# Patient Record
Sex: Female | Born: 1940
Health system: Southern US, Community
[De-identification: ages and names within clinical notes are randomized; demographics above are authoritative.]

## PROBLEM LIST (undated history)

## (undated) DIAGNOSIS — I10 Essential (primary) hypertension: Secondary | ICD-10-CM

## (undated) DIAGNOSIS — M810 Age-related osteoporosis without current pathological fracture: Secondary | ICD-10-CM

## (undated) HISTORY — DX: Essential (primary) hypertension: I10

## (undated) HISTORY — PX: TONSILLECTOMY: SUR1361

## (undated) HISTORY — PX: TOOTH EXTRACTION: SUR596

## (undated) HISTORY — PX: KNEE SURGERY: SHX244

## (undated) HISTORY — PX: DILATION AND CURETTAGE OF UTERUS: SHX78

## (undated) HISTORY — DX: Age-related osteoporosis without current pathological fracture: M81.0

---

## 1992-10-06 ENCOUNTER — Encounter: Payer: Self-pay | Admitting: Internal Medicine

## 1998-09-25 ENCOUNTER — Encounter: Admission: RE | Admit: 1998-09-25 | Discharge: 1998-12-24 | Payer: Self-pay | Admitting: *Deleted

## 1999-04-06 ENCOUNTER — Other Ambulatory Visit: Admission: RE | Admit: 1999-04-06 | Discharge: 1999-04-06 | Payer: Self-pay | Admitting: *Deleted

## 1999-04-24 ENCOUNTER — Other Ambulatory Visit: Admission: RE | Admit: 1999-04-24 | Discharge: 1999-04-24 | Payer: Self-pay | Admitting: *Deleted

## 1999-08-21 ENCOUNTER — Emergency Department (HOSPITAL_COMMUNITY): Admission: EM | Admit: 1999-08-21 | Discharge: 1999-08-21 | Payer: Self-pay | Admitting: Emergency Medicine

## 2000-04-06 ENCOUNTER — Other Ambulatory Visit: Admission: RE | Admit: 2000-04-06 | Discharge: 2000-04-06 | Payer: Self-pay | Admitting: Obstetrics & Gynecology

## 2000-04-20 ENCOUNTER — Encounter (INDEPENDENT_AMBULATORY_CARE_PROVIDER_SITE_OTHER): Payer: Self-pay

## 2000-04-20 ENCOUNTER — Other Ambulatory Visit: Admission: RE | Admit: 2000-04-20 | Discharge: 2000-04-20 | Payer: Self-pay | Admitting: Obstetrics & Gynecology

## 2000-04-20 ENCOUNTER — Encounter: Payer: Self-pay | Admitting: Obstetrics & Gynecology

## 2000-04-20 ENCOUNTER — Encounter: Admission: RE | Admit: 2000-04-20 | Discharge: 2000-04-20 | Payer: Self-pay | Admitting: Obstetrics & Gynecology

## 2001-04-07 ENCOUNTER — Encounter: Payer: Self-pay | Admitting: Obstetrics & Gynecology

## 2001-04-07 ENCOUNTER — Encounter: Admission: RE | Admit: 2001-04-07 | Discharge: 2001-04-07 | Payer: Self-pay | Admitting: Obstetrics & Gynecology

## 2001-04-07 ENCOUNTER — Other Ambulatory Visit: Admission: RE | Admit: 2001-04-07 | Discharge: 2001-04-07 | Payer: Self-pay | Admitting: Obstetrics & Gynecology

## 2001-09-03 ENCOUNTER — Emergency Department (HOSPITAL_COMMUNITY): Admission: EM | Admit: 2001-09-03 | Discharge: 2001-09-03 | Payer: Self-pay | Admitting: Emergency Medicine

## 2002-04-12 ENCOUNTER — Other Ambulatory Visit: Admission: RE | Admit: 2002-04-12 | Discharge: 2002-04-12 | Payer: Self-pay | Admitting: Obstetrics & Gynecology

## 2003-04-17 ENCOUNTER — Encounter: Admission: RE | Admit: 2003-04-17 | Discharge: 2003-04-17 | Payer: Self-pay | Admitting: Obstetrics & Gynecology

## 2003-04-17 ENCOUNTER — Encounter: Payer: Self-pay | Admitting: Obstetrics & Gynecology

## 2004-04-23 ENCOUNTER — Encounter: Admission: RE | Admit: 2004-04-23 | Discharge: 2004-04-23 | Payer: Self-pay | Admitting: Internal Medicine

## 2004-05-04 ENCOUNTER — Other Ambulatory Visit: Admission: RE | Admit: 2004-05-04 | Discharge: 2004-05-04 | Payer: Self-pay | Admitting: Obstetrics & Gynecology

## 2004-11-11 ENCOUNTER — Ambulatory Visit: Payer: Self-pay | Admitting: Internal Medicine

## 2005-03-22 ENCOUNTER — Ambulatory Visit: Payer: Self-pay | Admitting: Internal Medicine

## 2005-03-24 ENCOUNTER — Ambulatory Visit: Payer: Self-pay | Admitting: Family Medicine

## 2005-04-26 ENCOUNTER — Ambulatory Visit: Payer: Self-pay

## 2005-05-06 ENCOUNTER — Encounter: Admission: RE | Admit: 2005-05-06 | Discharge: 2005-05-06 | Payer: Self-pay | Admitting: Internal Medicine

## 2006-05-09 ENCOUNTER — Encounter: Admission: RE | Admit: 2006-05-09 | Discharge: 2006-05-09 | Payer: Self-pay | Admitting: Obstetrics & Gynecology

## 2006-05-19 ENCOUNTER — Ambulatory Visit: Payer: Self-pay | Admitting: Internal Medicine

## 2006-12-05 ENCOUNTER — Ambulatory Visit: Payer: Self-pay | Admitting: Internal Medicine

## 2006-12-20 HISTORY — PX: COLONOSCOPY: SHX5424

## 2007-05-11 ENCOUNTER — Encounter: Admission: RE | Admit: 2007-05-11 | Discharge: 2007-05-11 | Payer: Self-pay | Admitting: Obstetrics & Gynecology

## 2007-07-11 ENCOUNTER — Ambulatory Visit: Payer: Self-pay | Admitting: Internal Medicine

## 2007-07-13 ENCOUNTER — Encounter (INDEPENDENT_AMBULATORY_CARE_PROVIDER_SITE_OTHER): Payer: Self-pay | Admitting: *Deleted

## 2007-07-14 ENCOUNTER — Encounter (INDEPENDENT_AMBULATORY_CARE_PROVIDER_SITE_OTHER): Payer: Self-pay | Admitting: *Deleted

## 2007-07-18 ENCOUNTER — Ambulatory Visit: Payer: Self-pay | Admitting: Internal Medicine

## 2007-07-20 ENCOUNTER — Encounter (INDEPENDENT_AMBULATORY_CARE_PROVIDER_SITE_OTHER): Payer: Self-pay | Admitting: *Deleted

## 2007-07-27 ENCOUNTER — Ambulatory Visit: Payer: Self-pay | Admitting: Internal Medicine

## 2007-07-28 ENCOUNTER — Encounter: Payer: Self-pay | Admitting: Internal Medicine

## 2007-07-28 LAB — CONVERTED CEMR LAB
Hemoglobin, Urine: NEGATIVE
Ketones, ur: NEGATIVE mg/dL
Leukocytes, UA: NEGATIVE
Nitrite: NEGATIVE
Protein, ur: NEGATIVE mg/dL
pH: 7 (ref 5.0–8.0)

## 2007-07-30 ENCOUNTER — Encounter: Payer: Self-pay | Admitting: Internal Medicine

## 2007-07-31 ENCOUNTER — Encounter (INDEPENDENT_AMBULATORY_CARE_PROVIDER_SITE_OTHER): Payer: Self-pay | Admitting: *Deleted

## 2007-07-31 ENCOUNTER — Encounter: Payer: Self-pay | Admitting: Internal Medicine

## 2007-11-07 ENCOUNTER — Ambulatory Visit: Payer: Self-pay | Admitting: Internal Medicine

## 2007-11-13 ENCOUNTER — Encounter: Payer: Self-pay | Admitting: Internal Medicine

## 2008-01-28 ENCOUNTER — Emergency Department (HOSPITAL_COMMUNITY): Admission: EM | Admit: 2008-01-28 | Discharge: 2008-01-28 | Payer: Self-pay | Admitting: Emergency Medicine

## 2008-01-30 ENCOUNTER — Ambulatory Visit: Payer: Self-pay | Admitting: Internal Medicine

## 2008-02-06 ENCOUNTER — Encounter: Payer: Self-pay | Admitting: Internal Medicine

## 2008-03-08 ENCOUNTER — Ambulatory Visit: Payer: Self-pay | Admitting: Internal Medicine

## 2008-05-14 ENCOUNTER — Encounter: Admission: RE | Admit: 2008-05-14 | Discharge: 2008-05-14 | Payer: Self-pay | Admitting: Obstetrics & Gynecology

## 2009-05-15 ENCOUNTER — Encounter: Admission: RE | Admit: 2009-05-15 | Discharge: 2009-05-15 | Payer: Self-pay | Admitting: Obstetrics & Gynecology

## 2009-06-11 ENCOUNTER — Encounter: Payer: Self-pay | Admitting: Internal Medicine

## 2009-06-16 ENCOUNTER — Encounter: Payer: Self-pay | Admitting: Internal Medicine

## 2009-06-19 ENCOUNTER — Ambulatory Visit: Payer: Self-pay | Admitting: Internal Medicine

## 2009-06-19 DIAGNOSIS — R9431 Abnormal electrocardiogram [ECG] [EKG]: Secondary | ICD-10-CM

## 2009-06-19 DIAGNOSIS — E785 Hyperlipidemia, unspecified: Secondary | ICD-10-CM | POA: Insufficient documentation

## 2009-06-25 ENCOUNTER — Encounter (INDEPENDENT_AMBULATORY_CARE_PROVIDER_SITE_OTHER): Payer: Self-pay | Admitting: *Deleted

## 2009-06-25 ENCOUNTER — Encounter: Payer: Self-pay | Admitting: Internal Medicine

## 2009-07-01 ENCOUNTER — Telehealth (INDEPENDENT_AMBULATORY_CARE_PROVIDER_SITE_OTHER): Payer: Self-pay | Admitting: *Deleted

## 2009-12-18 ENCOUNTER — Encounter: Admission: RE | Admit: 2009-12-18 | Discharge: 2009-12-18 | Payer: Self-pay | Admitting: Obstetrics & Gynecology

## 2010-01-14 ENCOUNTER — Telehealth: Payer: Self-pay | Admitting: Internal Medicine

## 2010-01-16 ENCOUNTER — Encounter: Payer: Self-pay | Admitting: Internal Medicine

## 2010-05-19 ENCOUNTER — Encounter: Admission: RE | Admit: 2010-05-19 | Discharge: 2010-05-19 | Payer: Self-pay | Admitting: Obstetrics & Gynecology

## 2010-07-09 ENCOUNTER — Ambulatory Visit: Payer: Self-pay | Admitting: Internal Medicine

## 2010-07-09 DIAGNOSIS — M25519 Pain in unspecified shoulder: Secondary | ICD-10-CM

## 2010-07-09 DIAGNOSIS — M8580 Other specified disorders of bone density and structure, unspecified site: Secondary | ICD-10-CM

## 2010-07-21 ENCOUNTER — Ambulatory Visit: Payer: Self-pay | Admitting: Internal Medicine

## 2010-07-27 LAB — CONVERTED CEMR LAB
ALT: 32 units/L (ref 0–35)
Albumin: 3.9 g/dL (ref 3.5–5.2)
BUN: 16 mg/dL (ref 6–23)
CO2: 31 meq/L (ref 19–32)
Calcium: 9 mg/dL (ref 8.4–10.5)
Chloride: 108 meq/L (ref 96–112)
Creatinine, Ser: 0.8 mg/dL (ref 0.4–1.2)
Direct LDL: 145.9 mg/dL
Eosinophils Relative: 3.4 % (ref 0.0–5.0)
Glucose, Bld: 94 mg/dL (ref 70–99)
HCT: 39.5 % (ref 36.0–46.0)
HDL: 57.1 mg/dL (ref >39.00)
Hemoglobin: 13.1 g/dL (ref 12.0–15.0)
Lymphs Abs: 1.6 10*3/uL (ref 0.7–4.0)
MCV: 84.4 fL (ref 78.0–100.0)
Monocytes Absolute: 0.5 10*3/uL (ref 0.1–1.0)
Monocytes Relative: 9.8 % (ref 3.0–12.0)
Neutro Abs: 3.2 10*3/uL (ref 1.4–7.7)
Platelets: 263 10*3/uL (ref 150.0–400.0)
RDW: 14.2 % (ref 11.5–14.6)
Total Protein: 6.7 g/dL (ref 6.0–8.3)
Triglycerides: 97 mg/dL (ref 0.0–149.0)

## 2010-07-29 ENCOUNTER — Ambulatory Visit: Payer: Self-pay | Admitting: Internal Medicine

## 2010-07-31 ENCOUNTER — Ambulatory Visit: Payer: Self-pay | Admitting: Internal Medicine

## 2010-07-31 DIAGNOSIS — L0390 Cellulitis, unspecified: Secondary | ICD-10-CM

## 2010-07-31 DIAGNOSIS — L0291 Cutaneous abscess, unspecified: Secondary | ICD-10-CM

## 2010-09-30 ENCOUNTER — Encounter: Payer: Self-pay | Admitting: Internal Medicine

## 2011-01-17 LAB — CONVERTED CEMR LAB
AST: 24 units/L (ref 0–37)
Albumin: 4.4 g/dL (ref 3.5–5.2)
Alkaline Phosphatase: 46 units/L (ref 39–117)
BUN: 16 mg/dL (ref 6–23)
Basophils Absolute: 0 10*3/uL (ref 0.0–0.1)
Basophils Relative: 0.2 % (ref 0.0–1.0)
Bilirubin, Direct: 0 mg/dL (ref 0.0–0.3)
Bilirubin, Direct: 0.1 mg/dL (ref 0.0–0.3)
CO2: 31 meq/L (ref 19–32)
CO2: 32 meq/L (ref 19–32)
Calcium: 9.2 mg/dL (ref 8.4–10.5)
Chloride: 104 meq/L (ref 96–112)
Chloride: 97 meq/L (ref 96–112)
Cholesterol: 198 mg/dL (ref 0–200)
Creatinine, Ser: 0.8 mg/dL (ref 0.4–1.2)
Creatinine, Ser: 0.8 mg/dL (ref 0.4–1.2)
Eosinophils Absolute: 0.2 10*3/uL (ref 0.0–0.7)
Eosinophils Relative: 1.8 % (ref 0.0–5.0)
Glucose, Bld: 90 mg/dL (ref 70–99)
HCT: 41.9 % (ref 36.0–46.0)
Hemoglobin: 14 g/dL (ref 12.0–15.0)
LDL Cholesterol: 128 mg/dL — ABNORMAL HIGH (ref 0–99)
Lymphocytes Relative: 32.2 % (ref 12.0–46.0)
MCHC: 33.4 g/dL (ref 30.0–36.0)
MCHC: 34 g/dL (ref 30.0–36.0)
MCV: 84.6 fL (ref 78.0–100.0)
Monocytes Absolute: 0.4 10*3/uL (ref 0.1–1.0)
Monocytes Absolute: 0.6 10*3/uL (ref 0.2–0.7)
Neutrophils Relative %: 54.7 % (ref 43.0–77.0)
Neutrophils Relative %: 58.3 % (ref 43.0–77.0)
Platelets: 275 10*3/uL (ref 150.0–400.0)
Potassium: 3.7 meq/L (ref 3.5–5.1)
RBC: 4.9 M/uL (ref 3.87–5.11)
RDW: 12.8 % (ref 11.5–14.6)
Sodium: 135 meq/L (ref 135–145)
TSH: 2.55 microintl units/mL (ref 0.35–5.50)
TSH: 2.84 microintl units/mL (ref 0.35–5.50)
Total Bilirubin: 0.8 mg/dL (ref 0.3–1.2)
Total Bilirubin: 0.9 mg/dL (ref 0.3–1.2)
Total CHOL/HDL Ratio: 3.9
Total Protein: 7.5 g/dL (ref 6.0–8.3)
WBC: 4.6 10*3/uL (ref 4.5–10.5)
WBC: 5.8 10*3/uL (ref 4.5–10.5)

## 2011-01-19 NOTE — Miscellaneous (Signed)
Summary: [facility]  [facility]   Imported By: Lanelle Bal 10/12/2010 14:21:45  _____________________________________________________________________  External Attachment:    Type:   Image     Comment:   External Document

## 2011-01-19 NOTE — Assessment & Plan Note (Signed)
Summary: reaction to pneumonia vac/cbs   Vital Signs:  Patient profile:   70 year old female Weight:      204.2 pounds Temp:     98.4 degrees F oral Pulse rate:   77 / minute Resp:     14 per minute BP sitting:   118 / 76  (left arm) Cuff size:   large  Vitals Entered By: Shonna Chock CMA (July 31, 2010 4:37 PM) CC: Reaction to pneumonia vaccine given on 07/29/10: Arm is red, swollen, painful, and warm/tender to the touch   CC:  Reaction to pneumonia vaccine given on 07/29/10: Arm is red, swollen, painful, and and warm/tender to the touch.  History of Present Illness: She noted soreness @ injection site  Weds afternoon after Pneumonia  vaccine that am. Redness was also noted that afternoon; it has progressed in size . Rx: OTC pain relievers with minimal response.No PMH of immunization reaction or egg intolerance. Motrin caused nightmares.  Current Medications (verified): 1)  Centrum Silver 2)  Glucosamine 1500mg  3)  Caltrate 600+d 600-400 Mg-Unit Tabs (Calcium Carbonate-Vitamin D) .Marland Kitchen.. 1 By Mouth Once Daily 4)  Vit D1000 5)  Fish Oil 6)  Aspirin 81 Mg Tabs (Aspirin) .Marland Kitchen.. 1 By Mouth Once Daily  Allergies: 1)  Motrin  Review of Systems General:  Denies chills and fever; Hot flashes. Resp:  Denies shortness of breath and wheezing. MS:  Complains of joint pain; R shoulder pain persists. Derm:  Complains of changes in color of skin; denies itching. Allergy:  Denies hives or rash; No angioedema.  Physical Exam  General:  well-nourished,in no acute distress; alert,appropriate and cooperative throughout examination Mouth:  Oral mucosa and oropharynx without lesions or exudates.  Teeth in good repair. Lungs:  Normal respiratory effort, chest expands symmetrically. Lungs are clear to auscultation, no crackles or wheezes. Heart:  regular rhythm, no murmur, no gallop, no rub, no JVD, and bradycardia.   Skin:  5.5X 4.5 cm erythematous area with tenderness & increased temp Cervical  Nodes:  No lymphadenopathy noted Axillary Nodes:  No palpable lymphadenopathy   Impression & Recommendations:  Problem # 1:  CELLULITIS (ICD-682.9)  Orders: Prescription Created Electronically [phone])  Her updated medication list for this problem includes:    Cephalexin 500 Mg Caps (Cephalexin) .Marland Kitchen... 1 two times a day  Problem # 2:  SHOULDER PAIN, RIGHT (ICD-719.41)  Her updated medication list for this problem includes:    Aspirin 81 Mg Tabs (Aspirin) .Marland Kitchen... 1 by mouth once daily    Tramadol Hcl 50 Mg Tabs (Tramadol hcl) .Marland Kitchen... 1/2-1 every 6 hrs as needed  for pain  Orders: Prescription Created Electronically [phone]) Physical Therapy Referral (PT)  Complete Medication List: 1)  Centrum Silver  2)  Glucosamine 1500mg   3)  Caltrate 600+d 600-400 Mg-unit Tabs (Calcium carbonate-vitamin d) .Marland Kitchen.. 1 by mouth once daily 4)  Vit D1000  5)  Fish Oil  6)  Aspirin 81 Mg Tabs (Aspirin) .Marland Kitchen.. 1 by mouth once daily 7)  Cephalexin 500 Mg Caps (Cephalexin) .Marland Kitchen.. 1 two times a day 8)  Tramadol Hcl 50 Mg Tabs (Tramadol hcl) .... 1/2-1 every 6 hrs as needed  for pain  Patient Instructions: 1)  Cool compresses 3-4X/ day as needed for pain or tenderness. Measure size of redness everyday. Report fever or red streaks going up arm. Prescriptions: TRAMADOL HCL 50 MG TABS (TRAMADOL HCL) 1/2-1 every 6 hrs as needed  for pain  #30 x 0   Entered and Authorized by:  Marga Melnick MD   Signed by:   Marga Melnick MD on 07/31/2010   Method used:   Faxed to ...       [facility]  [address] # [id number]* (retail)       [address]       [place], [place]  [address]       Ph: [phone] or [phone]       Fax: [phone]   RxID:   [id number] CEPHALEXIN 500 MG CAPS (CEPHALEXIN) 1 two times a day  #14 x 0   Entered and Authorized by:   Marga Melnick MD   Signed by:   Marga Melnick MD on 07/31/2010   Method used:   Faxed to ...       [facility]  [address] # [id number]* (retail)        [address]       [place], [place]  [address]       Ph: [phone] or [phone]       Fax: [phone]   RxID:   [id number]

## 2011-01-19 NOTE — Progress Notes (Signed)
Summary: Family History Worksheet Brought by Patient  Family History Worksheet Brought by Patient   Imported By: Lanelle Bal 07/16/2010 13:16:30  _____________________________________________________________________  External Attachment:    Type:   Image     Comment:   External Document

## 2011-01-19 NOTE — Progress Notes (Signed)
Summary: ? right med   Phone Note Call from Patient   Caller: Patient Summary of Call: Pt left VM that she is taking glucosamine 1500mg  2 tab once daily and vitamin d 1000units once daily and her right thumb continues to pop in and out of joint and it is very painful for her. pt would like to know is she taking the right meds for this. dr Davante Gerke please advise.................Marland KitchenFelecia Deloach CMA  January 14, 2010 10:06 AM    Follow-up for Phone Call        see referral  PATIENT HAS AN APPT W/[facility]/DR. KUZMA, ON 01-16-2010.  I SPOKE WITH PT SHE IS AWARE. Follow-up by: Magdalen Spatz [facility],  January 14, 2010 2:24 PM  New Problems: THUMB PAIN (ICD-729.5)   New Problems: THUMB PAIN (ICD-729.5)

## 2011-01-19 NOTE — Consult Note (Signed)
Summary: [facility]   Imported By: Lanelle Bal 01/26/2010 09:36:55  _____________________________________________________________________  External Attachment:    Type:   Image     Comment:   External Document

## 2011-01-19 NOTE — Assessment & Plan Note (Signed)
Summary: YEARLY CHECK/CBS   Vital Signs:  Patient profile:   70 year old female Height:      64.25 inches Weight:      201 pounds BMI:     34.36 Temp:     98.4 degrees F oral Pulse rate:   84 / minute Resp:     14 per minute BP sitting:   138 / 84  (left arm) Cuff size:   large  Vitals Entered By: Shonna Chock CMA (July 09, 2010 2:18 PM) CC: CPX, Lipid Management   CC:  CPX and Lipid Management.  History of Present Illness: Here for Medicare AWV: 1.Risk factors based on Past M, S, F history:dyslipidemia, abnormal EKG, Osteopenia (see updated chart entries) 2.Physical Activities:see entry  3.Depression/mood:  4.Hearing: whisper heard @ 6 feet 5.ADL's:  no limitations 6.Fall Risk: none  7.Home Safety:  no risk; firearms locked  8.Height, weight, &visual acuity:wall chart read with lenses @ 6 ft 9.Counseling: none requested exceot recent RUE injury 10.Labs ordered based on risk factors: see Orders 11.Referral Coordination: Physical Therapy if no better with swimming laps 12. Care Plan: see Instructions 13. Cognitive Assessment:memory intact ; subtraction of 7 normal; recall normal; OX 3   Lipid Management History:      Positive NCEP/ATP III risk factors include female age 30 years old or older.  Negative NCEP/ATP III risk factors include non-diabetic, no family history for ischemic heart disease, non-tobacco-user status, non-hypertensive, no ASHD (atherosclerotic heart disease), no prior stroke/TIA, no peripheral vascular disease, and no history of aortic aneurysm.     Preventive Screening-Counseling & Management  Alcohol-Tobacco     Alcohol drinks/day: 0     Smoking Status: never  Caffeine-Diet-Exercise     Caffeine use/day: occasioanlly     Diet Comments: no diet     Does Patient Exercise: no  Hep-HIV-STD-Contraception     Dental Visit-last 6 months yes     Sun Exposure-Excessive: yes     Sun Exposure Counseling: to decrease sun  exposure  Safety-Violence-Falls     Seat Belt Use: yes     Firearms in the Home: firearms in the home     Firearm Counseling: husband V N Physicist, medical: yes     Violence in the Home: no risk noted     Sexual Abuse: no     Fall Risk: denied      Sexual History:  currently monogamous.        Drug Use:  never.        Blood Transfusions:  no.        Travel History:  no foreign travel since 1991.    Allergies: 1)  Motrin  Past History:  Past Medical History: Hyperlipidemia: [place] LDL goal = < 160. NS ST-T EKG changes, stable since 2003; cervical DDD  with RUE radicular pain; Osteopenia, Dr Seymour Bars  Past Surgical History:  G 2 p 2 ;D&C; uterine biopsy; Colonoscopy negative Tonsillectomy  Family History: Father:post herpetic neuralgia, CABG in 32s  Mother: DDD Siblings: sister : DDD. 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 [employer] Does Patient Exercise:  no Dental Care w/in 6 mos.:  yes Sun Exposure-Excessive:  yes Seat Belt Use:  yes Fall Risk:  denied Sexual History:  currently monogamous Drug Use:  never Blood Transfusions:  no  Review of Systems  The patient denies anorexia, fever, vision loss, decreased hearing, hoarseness,  chest pain, syncope, dyspnea on exertion, peripheral edema, headaches, hemoptysis, abdominal pain, melena, hematochezia, severe indigestion/heartburn, hematuria, incontinence, suspicious skin lesions, depression, unusual weight change, abnormal bleeding, enlarged lymph nodes, and angioedema.         Weight up 25#. Chronic throat clearing  & hoarseness better with Neti pot MS:  Complains of joint pain; denies joint redness, joint swelling, low back pain, mid back pain, and thoracic pain. Neuro:  Denies difficulty with concentration, poor balance, tingling, and weakness; Occasional  numbness R thigh. Allergy:  Complains  of sneezing; denies itching eyes.  Physical Exam  General:  well-nourished,in no acute distress; alert,appropriate and cooperative throughout examination Head:  Normocephalic and atraumatic without obvious abnormalities.  Eyes:  No corneal or conjunctival inflammation noted. Perrla. Funduscopic exam benign, without hemorrhages, exudates or papilledema. Ears:  External ear exam shows no significant lesions or deformities.  Otoscopic examination reveals clear canals, tympanic membranes are intact bilaterally without bulging, retraction, inflammation or discharge. Hearing is grossly normal bilaterally. Some wax bilaterally Nose:  External nasal examination shows no deformity or inflammation. Nasal mucosa are pink and moist without lesions or exudates. Mouth:  Oral mucosa and oropharynx without lesions or exudates.  Teeth in good repair. Neck:  No deformities, masses, or tenderness noted. Lungs:  Normal respiratory effort, chest expands symmetrically. Lungs are clear to auscultation, no crackles or wheezes. Heart:  Normal rate and regular rhythm. Split  S1 without gallop, click, rub . Grade1/2-  1 systolic murmur Abdomen:  Bowel sounds positive,abdomen soft and non-tender without masses, organomegaly or hernias noted. Genitalia:  Dr Seymour Bars Msk:  No deformity or scoliosis noted of thoracic or lumbar spine.   Pulses:  R and L carotid,radial,dorsalis pedis and posterior tibial pulses are full and equal bilaterally Extremities:  No clubbing, cyanosis, edema. Full ROM R shoulder. Minor DIP OA Neurologic:  alert & oriented X3, strength normal in all extremities, and DTRs symmetrical and normal.   Skin:  Intact without suspicious lesions or rashes Cervical Nodes:  No lymphadenopathy noted Axillary Nodes:  No palpable lymphadenopathy Psych:  memory intact for recent and remote, normally interactive, good eye contact, not anxious appearing, and not depressed appearing.     Impression &  Recommendations:  Problem # 1:  PREVENTIVE HEALTH CARE (ICD-V70.0)  Orders: Subsequent annual wellness visit with prevention plan (G9562)  Problem # 2:  SHOULDER PAIN, RIGHT (ICD-719.41)  Problem # 3:  HYPERLIPIDEMIA (ICD-272.4)  Problem # 4:  NONSPECIFIC ABNORMAL ELECTROCARDIOGRAM (ICD-794.31)  stable  Orders: EKG w/ Interpretation (93000)  Problem # 5:  OSTEOPENIA (ICD-733.90)  Complete Medication List: 1)  Centrum Silver  2)  Glucosamine 1500mg   3)  Caltrate 600+d 600-400 Mg-unit Tabs (Calcium carbonate-vitamin d) .Marland Kitchen.. 1 by mouth once daily 4)  Vit D1000  5)  Fish Oil   Other Orders: State-TD Vaccine 7 yrs. & > IM (13086V) Admin 1st Vaccine (78469)  Lipid Assessment/Plan:      Based on NCEP/ATP III, the patient's risk factor category is "0-1 risk factors".  The patient's lipid goals are as follows: Total cholesterol goal is 200; LDL cholesterol goal is 160; HDL cholesterol goal is 40; Triglyceride goal is 150.  Her LDL cholesterol goal has been met.    Patient Instructions: 1)  Please schedule fasting labs: vit D level; 2)  BMP ; 3)  Hepatic Panel ; 4)  Lipid Panel ; 5)  TSH ; 6)  CBC w/ Diff. 7)  Take an  81 mg coated Aspirin every day.Physical  Therapy if shoulder does not improve. Call if you reconsider pain meds   Immunizations Administered:  Tetanus Vaccine:    Vaccine Type: Tdap (State)    Site: left deltoid    Mfr: GlaxoSmithKline    Dose: 0.5 ml    Route: IM    Given by: Shonna Chock CMA    Exp. Date: 03/13/2012    Lot #: [id number]    VIS given: 11/07/07 version given July 09, 2010.

## 2011-01-19 NOTE — Assessment & Plan Note (Signed)
Summary: PNEUMONIA SHOT///SPH  Nurse Visit   Allergies: 1)  Motrin  Immunizations Administered:  Pneumonia Vaccine:    Vaccine Type: Pneumovax (Medicare)    Site: right deltoid    Mfr: Merck    Dose: 0.5 ml    Route: IM    Given by: Shonna Chock CMA    Exp. Date: 01/06/2012    Lot #: [id number]  Orders Added: 1)  Pneumococcal Vaccine [90732] 2)  Admin 1st Vaccine [75643]  Appended Document: PNEUMONIA SHOT///SPH

## 2011-04-16 ENCOUNTER — Other Ambulatory Visit: Payer: Self-pay | Admitting: Obstetrics & Gynecology

## 2011-04-16 DIAGNOSIS — Z1231 Encounter for screening mammogram for malignant neoplasm of breast: Secondary | ICD-10-CM

## 2011-05-06 ENCOUNTER — Ambulatory Visit (AMBULATORY_SURGERY_CENTER): Payer: Medicare Other

## 2011-05-06 ENCOUNTER — Encounter: Payer: Self-pay | Admitting: Internal Medicine

## 2011-05-06 VITALS — Ht 64.0 in | Wt 191.5 lb

## 2011-05-06 DIAGNOSIS — Z1211 Encounter for screening for malignant neoplasm of colon: Secondary | ICD-10-CM

## 2011-05-06 MED ORDER — PEG-KCL-NACL-NASULF-NA ASC-C 100 G PO SOLR: 1.0000 | Freq: Once | ORAL | Status: AC

## 2011-05-14 ENCOUNTER — Ambulatory Visit (INDEPENDENT_AMBULATORY_CARE_PROVIDER_SITE_OTHER): Payer: Medicare Other | Admitting: Internal Medicine

## 2011-05-14 ENCOUNTER — Encounter: Payer: Self-pay | Admitting: Internal Medicine

## 2011-05-14 VITALS — BP 122/80 | HR 60 | Temp 98.2°F | Wt 192.4 lb

## 2011-05-14 DIAGNOSIS — M25569 Pain in unspecified knee: Secondary | ICD-10-CM

## 2011-05-14 DIAGNOSIS — M25562 Pain in left knee: Secondary | ICD-10-CM

## 2011-05-14 MED ORDER — TRAMADOL HCL 50 MG PO TABS: 50.0000 mg | ORAL_TABLET | Freq: Four times a day (QID) | ORAL | Status: AC | PRN

## 2011-05-14 NOTE — Progress Notes (Signed)
  Subjective:    Patient ID: Abigail Long, female    DOB: May 26, 1941, 70 y.o.   MRN: [id number]  HPI Extremity pain Onset:3 weeks ago Trigger/injury:she slipped on wet grass  & did split with hyperflexion of L knee & hyperextension of groin w/o feeling or hearing a "pop" Quality:dull     Severity : up to 4  Duration: intermittent; worse with getting up , must "hoist " herself up ROS: Constitutional: no fever, chills, sweats, change in weight Musculoskeletal:no muscle cramp but muscle pain in various locations in LLE ( mainly around L knee , upper calf & minimally to L ankle); no  joint stiffness, redness, or swelling Skin:no rash, color change Neuro:no  weakness, incontinence (stool/urine), numbness and tingling, tremor Heme:no lymphadenopathy, abnormal bruising or clotting Treatment/response:Aleve 2 tabs every 8 hrs with benefit     Review of Systems     Objective:   Physical Exam she appears healthy and in no acute distress. There is some discomfort with range of motion testing of the left lower extremity.  She is able to lay back and sit up without help. With ambulation she limps on the left knee.  Deep tendon reflexes are 0-1/2+ at the knees.  Pedal pulses are intact.  There is no clubbing, cyanosis or edema present.  There is slight tenderness in the left popliteal space to palpation. Homans sign is negative. Strength is excellent and both lower extremities.  There no significant skin changes. There is mild crepitus of the left knee without demonstrable effusion.        Assessment & Plan:  #1 hyperflexion injury of the left knee  #2 possible popliteal cyst related to #1  Plan: #1 tramadol 50 mg one half to one every 6 hours as needed for pain. She'll be asked to use warm moist compresses or Zostrix cream  to the popliteal space twice a day. Orthopedist to assess soft tissue damage is indicated. This evaluation is appropriate as she believes that the symptoms are  progressing rather than improving over  the three-week period. ( Note: she declined referral until trial of Tramadol)

## 2011-05-14 NOTE — Patient Instructions (Addendum)
Please call if you do not improve with the measures recommended. Orthopedic consult will be scheduled if you are  in agreement. Report pain & swelling in L calf.

## 2011-05-19 ENCOUNTER — Encounter: Payer: Self-pay | Admitting: Internal Medicine

## 2011-05-20 ENCOUNTER — Encounter: Payer: Self-pay | Admitting: Internal Medicine

## 2011-05-20 ENCOUNTER — Ambulatory Visit (AMBULATORY_SURGERY_CENTER): Payer: Medicare Other | Admitting: Internal Medicine

## 2011-05-20 VITALS — BP 184/68 | HR 62 | Temp 98.7°F | Resp 20 | Ht 64.0 in | Wt 190.0 lb

## 2011-05-20 DIAGNOSIS — K5730 Diverticulosis of large intestine without perforation or abscess without bleeding: Secondary | ICD-10-CM

## 2011-05-20 DIAGNOSIS — Z1211 Encounter for screening for malignant neoplasm of colon: Secondary | ICD-10-CM

## 2011-05-20 MED ORDER — SODIUM CHLORIDE 0.9 % IV SOLN: 500.0000 mL | INTRAVENOUS | Status: DC

## 2011-05-20 NOTE — Patient Instructions (Signed)
Green and blue discharge instructions sheets reviewed with patient and care partner.  Blue discharge instruction sheet signed by care partner.  Impressions/Recommendations:  Mild Diverticulosis (handout given)  Repeat colonoscopy in 10 years.

## 2011-05-21 ENCOUNTER — Telehealth: Payer: Self-pay

## 2011-05-21 DIAGNOSIS — M25569 Pain in unspecified knee: Secondary | ICD-10-CM

## 2011-05-21 NOTE — Telephone Encounter (Signed)

## 2011-05-21 NOTE — Telephone Encounter (Signed)
Pt called would like to go ahead w/ ortho referral

## 2011-05-24 ENCOUNTER — Ambulatory Visit
Admission: RE | Admit: 2011-05-24 | Discharge: 2011-05-24 | Disposition: A | Discharge status: Home / Self Care | Payer: Medicare Other | Source: Ambulatory Visit | Attending: Obstetrics & Gynecology | Admitting: Obstetrics & Gynecology

## 2011-05-24 DIAGNOSIS — Z1231 Encounter for screening mammogram for malignant neoplasm of breast: Secondary | ICD-10-CM

## 2011-07-22 ENCOUNTER — Other Ambulatory Visit: Payer: Self-pay | Admitting: Orthopedic Surgery

## 2011-07-22 ENCOUNTER — Encounter (HOSPITAL_COMMUNITY): Payer: Medicare Other

## 2011-07-22 LAB — CBC
HCT: 40.5 % (ref 36.0–46.0)
MCH: 26.7 pg (ref 26.0–34.0)
MCHC: 31.9 g/dL (ref 30.0–36.0)
MCV: 83.7 fL (ref 78.0–100.0)
RDW: 14.2 % (ref 11.5–15.5)

## 2011-07-22 LAB — COMPREHENSIVE METABOLIC PANEL
ALT: 27 U/L (ref 0–35)
AST: 24 U/L (ref 0–37)
CO2: 32 mEq/L (ref 19–32)
Calcium: 9.7 mg/dL (ref 8.4–10.5)
Chloride: 101 mEq/L (ref 96–112)
GFR calc non Af Amer: >60 mL/min (ref >60)
Potassium: 4.7 mEq/L (ref 3.5–5.1)
Sodium: 138 mEq/L (ref 135–145)

## 2011-07-22 LAB — DIFFERENTIAL
Basophils Absolute: 0 10*3/uL (ref 0.0–0.1)
Basophils Relative: 0 % (ref 0–1)
Eosinophils Absolute: 0.2 10*3/uL (ref 0.0–0.7)
Eosinophils Relative: 3 % (ref 0–5)
Monocytes Absolute: 0.6 10*3/uL (ref 0.1–1.0)
Monocytes Relative: 11 % (ref 3–12)
Neutro Abs: 3.3 10*3/uL (ref 1.7–7.7)

## 2011-07-22 LAB — APTT: aPTT: 25 seconds (ref 24–37)

## 2011-07-22 LAB — URINALYSIS, ROUTINE W REFLEX MICROSCOPIC
Leukocytes, UA: NEGATIVE
Protein, ur: NEGATIVE mg/dL

## 2011-07-28 ENCOUNTER — Ambulatory Visit (HOSPITAL_COMMUNITY)
Admission: RE | Admit: 2011-07-28 | Discharge: 2011-07-28 | Disposition: A | Discharge status: Home / Self Care | Payer: Medicare Other | Source: Ambulatory Visit | Attending: Orthopedic Surgery | Admitting: Orthopedic Surgery

## 2011-07-28 DIAGNOSIS — Z01812 Encounter for preprocedural laboratory examination: Secondary | ICD-10-CM | POA: Insufficient documentation

## 2011-07-28 DIAGNOSIS — M659 Unspecified synovitis and tenosynovitis, unspecified site: Secondary | ICD-10-CM | POA: Insufficient documentation

## 2011-07-28 DIAGNOSIS — M1710 Unilateral primary osteoarthritis, unspecified knee: Secondary | ICD-10-CM | POA: Insufficient documentation

## 2011-07-28 DIAGNOSIS — M6750 Plica syndrome, unspecified knee: Secondary | ICD-10-CM | POA: Insufficient documentation

## 2011-07-28 DIAGNOSIS — M23329 Other meniscus derangements, posterior horn of medial meniscus, unspecified knee: Secondary | ICD-10-CM | POA: Insufficient documentation

## 2011-07-28 DIAGNOSIS — Z01810 Encounter for preprocedural cardiovascular examination: Secondary | ICD-10-CM | POA: Insufficient documentation

## 2011-07-28 DIAGNOSIS — M23302 Other meniscus derangements, unspecified lateral meniscus, unspecified knee: Secondary | ICD-10-CM | POA: Insufficient documentation

## 2011-08-02 NOTE — Op Note (Signed)
NAMECHRISTIANA, Abigail Long               ACCOUNT NO.:  [id number]  MEDICAL RECORD NO.:  [id number]  LOCATION:  DAYL                         FACILITY:  [facility]  PHYSICIAN:  Georges Lynch. Rosalin Buster, M.D.DATE OF BIRTH:  11-07-1941  DATE OF PROCEDURE:  07/28/2011 DATE OF DISCHARGE:  07/28/2011                              OPERATIVE REPORT   SURGEON:  Windy Fast A. Darrelyn Hillock, M.D.  ASSISTANT:  Nurse.  PREOPERATIVE DIAGNOSES: 1. Torn medial meniscus, left knee. 2. Torn lateral meniscus, left knee. 3. Plica of left knee. 4. 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  POSTOPERATIVE DIAGNOSIS: 1. Torn medial meniscus, left knee. 2. Torn lateral meniscus, left knee. 3. Plica of left knee. 4. 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  OPERATION: 1. Diagnostic arthroscopy of the left knee. 2. Excision of plica, suprapatellar pouch, left knee. 3. Partial medial meniscectomy, left knee. 4. Partial lateral meniscectomy left knee.  PROCEDURE:  Under general anesthesia, routine orthopedic prep and draping of the left lower extremity carried out.  The appropriate time- out was carried out prior to make an incision.  Also in the holding area I marked the appropriate left leg.  Following this a small punctate incision was made in suprapatellar pouch after she was given 2 g of IV Ancef.  At this time an inflow cannula was inserted and the knee was distended with saline.  Another small incision was made in the anterolateral joint.  The arthroscope was entered from the lateral approach and a complete diagnostic arthroscopy was carried out immediately upon going in the suprapatellar area.  She had a rather significant synovitis in the plica and I did a synovectomy at this time, removed the plica.  I did utilize the ArthroCare to do this.  Then went down; the cruciates were intact.  Lateral meniscus, she had multiple peripheral tears.  Introduced shaver suction device and did a  partial lateral meniscectomy.  I went over the medial joint.  She had small tear of the posterior horn of the medial meniscus.  I removed that with a shaver suction device.  Of note, she did have "early" arthritis and arthritic changes in the medial femoral condyle.  I thoroughly irrigated out the knee.  There were no loose bodies.  No other abnormalities noted.  The remaining parts of the menisci were intact.  Irrigated the knee and removed the fluid and closed all three punctate incisions with 3-0 nylon suture.  I injected 20 mL of 0.25% Marcaine with epinephrine in the joint and a sterile Neosporin dressing was applied.  The patient left the operating room in satisfactory condition.  Postoperatively she will be on; 1. Aspirin 325 mg b.i.d., that is starting today for about 2 weeks. 2. She will be on Percocet 10/650 1 every 4 hours p.r.n. for pain. 3. She will be on a walker, partial to full weightbearing as     tolerated. 4. She will see me in the office in 10-12 days or prior to if there is     any problem.          ______________________________ Georges Lynch Darrelyn Hillock, M.D.     RAG/MEDQ  D:  07/28/2011  T:  07/29/2011  Job:  [id number]  Electronically Signed by Ranee Gosselin M.D. on 08/02/2011 08:25:23 AM

## 2011-09-10 LAB — POCT I-STAT CREATININE
Creatinine, Ser: 0.9
Operator id: 284251

## 2011-09-10 LAB — I-STAT 8, (EC8 V) (CONVERTED LAB)
Acid-Base Excess: 4 — ABNORMAL HIGH
Potassium: 3.8
TCO2: 31
pCO2, Ven: 48.5
pH, Ven: 7.394 — ABNORMAL HIGH

## 2011-09-10 LAB — POCT CARDIAC MARKERS: Troponin i, poc: <0.05

## 2011-09-20 ENCOUNTER — Ambulatory Visit (INDEPENDENT_AMBULATORY_CARE_PROVIDER_SITE_OTHER): Payer: Medicare Other | Admitting: Internal Medicine

## 2011-09-20 ENCOUNTER — Encounter: Payer: Self-pay | Admitting: Internal Medicine

## 2011-09-20 VITALS — BP 122/80 | HR 91 | Temp 98.9°F | Wt 200.8 lb

## 2011-09-20 DIAGNOSIS — J209 Acute bronchitis, unspecified: Secondary | ICD-10-CM

## 2011-09-20 MED ORDER — HYDROCODONE-HOMATROPINE 5-1.5 MG/5ML PO SYRP: 5.0000 mL | ORAL_SOLUTION | Freq: Four times a day (QID) | ORAL | Status: AC | PRN

## 2011-09-20 MED ORDER — AZITHROMYCIN 250 MG PO TABS: ORAL_TABLET | ORAL | Status: AC

## 2011-09-20 NOTE — Progress Notes (Signed)
  Subjective:    Patient ID: Abigail Long, female    DOB: 1941/02/13, 70 y.o.   MRN: [id number]  HPI Respiratory tract infection Onset/symptoms:3 days ago as ST Exposures (illness/environmental/extrinsic):granddaughter had "cold" Progression of symptoms:chest congestion Treatments/response:Robitussin , Sudafed 12 Hour, ASA Present symptoms: Fever/chills/sweats:minor fever & chill Frontal headache:no Facial pain:no Nasal purulence:no Dental pain:no Lymphadenopathy:no Wheezing/shortness of breath:no Cough/sputum/hemoptysis:productive but not visualized Associated extrinsic/allergic symptoms:itchy eyes/ sneezing:no Past medical history: Seasonal allergies/asthma:no Smoking history:never           Review of Systems She is  inquiring about some supplement for  weight loss. She's been on multiple diet programs in the past.     Objective:   Physical Exam General appearance is of good health and nourishment; no acute distress or increased work of breathing is present.  No  lymphadenopathy about the head, neck, or axilla noted.   Eyes: No conjunctival inflammation or lid edema is present.  Ears:  External ear exam shows no significant lesions or deformities.  Otoscopic examination reveals  Wax bilaterally partially obscuring TMs Nose:  External nasal examination shows no deformity or inflammation. Nasal mucosa are pink and moist without lesions or exudates. No septal dislocation or dislocation.No obstruction to airflow.   Oral exam: Dental hygiene is good; lips and gums are healthy appearing.There is no oropharyngeal erythema or exudate noted.    Heart:  Normal rate and regular rhythm. S1 and S2 normal without gallop, murmur, click, rub . S 4 Lungs:Chest clear to auscultation; no wheezes, rhonchi,rales ,or rubs present.No increased work of breathing. Dry cough   Extremities:  No cyanosis, edema, or clubbing  noted    Skin: Warm & dry          Assessment & Plan:  #1  bronchitis, acute  #2 minimal temperature elevation  Plan: See orders and recommendations.

## 2011-09-20 NOTE — Patient Instructions (Signed)
Plain Mucinex for thick secretions ;force NON dairy fluids for next 48 hrs. Use a Neti pot daily as needed for sinus congestion  Eat a low-fat diet with lots of fruits and vegetables, up to 7-9 servings per day. Avoid obesity; your goal is waist measurement < 40 inches.Consume less than 40 grams of sugar per day from foods & drinks with High Fructose Corn Sugar as #1,2,3 or # 4 on label. Follow the low carb nutrition program in [employer] as closely as possible to prevent Diabetes progression & complications. White carbohydrates (potatoes, rice, bread, and pasta) have a high spike of sugar and a high load of sugar. For example a  baked potato has a cup of sugar and a  french fry  2 teaspoons of sugar. Yams, wild  rice, whole grained bread &  wheat pasta have been much lower spike and load of  sugar. Portions should be the size of a deck of cards or your palm.

## 2012-05-12 ENCOUNTER — Other Ambulatory Visit: Payer: Self-pay | Admitting: Obstetrics & Gynecology

## 2012-05-12 DIAGNOSIS — Z1231 Encounter for screening mammogram for malignant neoplasm of breast: Secondary | ICD-10-CM

## 2012-05-25 ENCOUNTER — Ambulatory Visit
Admission: RE | Admit: 2012-05-25 | Discharge: 2012-05-25 | Disposition: A | Discharge status: Home / Self Care | Payer: Medicare Other | Source: Ambulatory Visit | Attending: Obstetrics & Gynecology | Admitting: Obstetrics & Gynecology

## 2012-05-25 DIAGNOSIS — Z1231 Encounter for screening mammogram for malignant neoplasm of breast: Secondary | ICD-10-CM

## 2012-07-05 ENCOUNTER — Telehealth: Payer: Self-pay | Admitting: Internal Medicine

## 2012-07-05 NOTE — Telephone Encounter (Signed)
This patient is not taking any medications that require labs, therefore we have no diagnosis to attach to labs. A CPX with the GYN is not the same as a CPX with a Primary Care doctor. Patient needs to schedule a CPX and labs can be coded with that appointment.

## 2012-07-05 NOTE — Telephone Encounter (Signed)
Patient called stating she needs to schedule labs. She states she already had her physical with her gynecologist, but still needs the blood work done. What labs will she need? I scheduled her for labs then an OV to discuss labs. Is this okay?

## 2012-07-06 ENCOUNTER — Other Ambulatory Visit: Payer: Medicare Other

## 2012-07-06 NOTE — Telephone Encounter (Signed)
Pt scheduled for CPE

## 2012-07-12 ENCOUNTER — Other Ambulatory Visit: Payer: Medicare Other

## 2012-07-12 ENCOUNTER — Ambulatory Visit: Payer: Medicare Other | Admitting: Internal Medicine

## 2012-07-18 ENCOUNTER — Telehealth: Payer: Self-pay | Admitting: Internal Medicine

## 2012-07-18 ENCOUNTER — Ambulatory Visit (INDEPENDENT_AMBULATORY_CARE_PROVIDER_SITE_OTHER): Payer: Medicare Other | Admitting: Internal Medicine

## 2012-07-18 ENCOUNTER — Encounter: Payer: Self-pay | Admitting: Internal Medicine

## 2012-07-18 ENCOUNTER — Other Ambulatory Visit: Payer: Medicare Other

## 2012-07-18 ENCOUNTER — Encounter: Payer: Medicare Other | Admitting: Internal Medicine

## 2012-07-18 VITALS — BP 104/68 | HR 74 | Temp 98.3°F | Resp 12 | Ht 64.0 in | Wt 185.0 lb

## 2012-07-18 DIAGNOSIS — E785 Hyperlipidemia, unspecified: Secondary | ICD-10-CM

## 2012-07-18 DIAGNOSIS — M899 Disorder of bone, unspecified: Secondary | ICD-10-CM

## 2012-07-18 DIAGNOSIS — R9431 Abnormal electrocardiogram [ECG] [EKG]: Secondary | ICD-10-CM

## 2012-07-18 DIAGNOSIS — Z0000 Encounter for general adult medical examination without abnormal findings: Secondary | ICD-10-CM

## 2012-07-18 NOTE — Telephone Encounter (Signed)
Done came in at 1

## 2012-07-18 NOTE — Addendum Note (Signed)
Addended by: Silvio Pate D on: 07/18/2012 04:41 PM   Modules accepted: Orders

## 2012-07-18 NOTE — Patient Instructions (Addendum)
Preventive Health Care: Exercise  30-45  minutes a day, 3-4 days a week. Walking is especially valuable in preventing Osteoporosis. Eat a low-fat diet with lots of fruits and vegetables, up to 7-9 servings per day.  Consume less than 30 grams of sugar per day from foods & drinks with High Fructose Corn Syrup as #1,2,3 or #4 on label. Health Care Power of Attorney & Living Will place you in charge of your health care  decisions. Verify these are  in place. Take the EKG to any emergency room or preop visits. There are nonspecific changes; as long as there is no new change in you have no symptoms with a high level of exercise;  these are not clinically significant.  Please do not use Q-tips as we discussed. Should wax build up occur, please put 2-3 drops of mineral oil in the ear at night and cover the canal with a  cotton ball.In the morning fill the canal with hydrogen peroxide & leave  for 10-15 minutes.Following this shower and use the thinnest washrag available to wick out the wax.  Please try to go on My Chart within the next 24 hours to allow me to release the results directly to you.

## 2012-07-18 NOTE — Telephone Encounter (Signed)
Please call patient regarding her physical today. She wants to come in prior to her CPE which is at 3:30pm today and she also would like to have additional labs drawn. She stated she was just leaving another appt., where the dx. Was Yeast Infection and that dr., advised her she needed to have HgbA1c. Cb# [phone] Pt., stated she would not eat til she heard back from someone

## 2012-07-18 NOTE — Progress Notes (Signed)
Subjective:    Patient ID: Abigail Long, female    DOB: 1941-01-13, 71 y.o.   MRN: [id number]  HPI Medicare Wellness Visit:  The following psychosocial & medical history were reviewed as required by Medicare.   Social history: caffeine: all decaf  , alcohol: no ,  tobacco use :no  & exercise :[facility] 3X/ week for 60 min  & walking 1 mpd 3 days. See EKG; she has no chest pain, palpitations, dyspnea, claudication with exercise.  Home & personal  safety / fall risk: no issues, activities of daily living: no limitations , seatbelt use : yes , and smoke alarm employment : yes.  Power of Attorney/Living Will status : NO  Vision ( as recorded per Nurse) & Hearing  evaluation :  Ophth seen 5/13 for floaters . No hearing exam. Orientation :oriented X3 , memory & recall :good,  math testing: good,and mood & affect : normal . Depression / anxiety: denied Travel history : never , immunization status :? Tetanus status , transfusion history:  no, and preventive health surveillance ( colonoscopies, BMD , etc as per protocol/ [facility]): colonoscopy up to date, Dental care:  Seen every 6 mos . Chart reviewed &  Updated. Active issues reviewed & addressed.       Review of Systems She has been asymptomatic until she developed dysuria 7/29 and hematuria today. She was  seen by the Gynecologist and placed on  medications for possible fungal vaginitis. A fasting blood sugar or A1c was recommended to rule out diabetes as predisposition. She denies polyuria, polyphagia, polydipsia. The only diabetic in the family is a first cousin     Objective:   Physical Exam Gen.: Healthy and well-nourished in appearance. Alert, appropriate and cooperative throughout exam. Head: Normocephalic without obvious abnormalities  Eyes: No corneal or conjunctival inflammation noted.  Extraocular motion intact. Vision grossly normal with lenses. Ears: External  ear exam reveals no significant lesions or deformities. Canals : wax bilaterally .  Hearing is grossly normal bilaterally. Nose: External nasal exam reveals no deformity or inflammation. Nasal mucosa are pink and moist. No lesions or exudates noted.  Mouth: Oral mucosa and oropharynx reveal no lesions or exudates. Teeth in good repair. Neck: No deformities, masses, or tenderness noted. Range of motion & Thyroid normal Lungs: Normal respiratory effort; chest expands symmetrically. Lungs are clear to auscultation without rales, wheezes, or increased work of breathing. Heart: Normal rate and rhythm. Normal S1 and S2. No gallop, click, or rub. Grade 1/2 over 6 systolic murmur  Abdomen: Bowel sounds normal; abdomen soft and nontender. No masses, organomegaly or hernias noted. Genitalia: Dr Seymour Bars                                                              Musculoskeletal/extremities: No deformity or scoliosis noted of  the thoracic or lumbar spine. No clubbing, cyanosis, edema, or deformity noted. Range of motion  normal .Tone & strength  normal.Joints ; MINOR OA finger changes. Nail health  good. Minor crepitus of the knees; no effusion present Vascular: Carotid, radial artery, dorsalis pedis and  posterior tibial pulses are full and equal. No bruits present. Neurologic: Alert and oriented x3. Deep tendon reflexes symmetrical and normal.          Skin: Intact  without suspicious lesions or rashes. Lymph: No cervical, axillary lymphadenopathy present. Psych: Mood and affect are normal. Normally interactive                                                                                         Assessment & Plan:  #1 Medicare Wellness Exam; criteria met ; data entered #2 Problem List reviewed ; Assessment/ Recommendations made  #3 fungal vaginitis versus urinary tract infection. Urine culture is pending. Historically and upon review of the chart ; diabetic risk is not high Plan: see Orders

## 2012-07-18 NOTE — Telephone Encounter (Signed)
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 Dr.Hopper will place orders at [facility]

## 2012-07-19 LAB — HEPATIC FUNCTION PANEL
ALT: 24 U/L (ref 0–35)
AST: 25 U/L (ref 0–37)
Alkaline Phosphatase: 45 U/L (ref 39–117)
Bilirubin, Direct: 0.1 mg/dL (ref 0.0–0.3)
Total Protein: 7.2 g/dL (ref 6.0–8.3)

## 2012-07-19 LAB — CBC WITH DIFFERENTIAL/PLATELET
Basophils Absolute: 0 10*3/uL (ref 0.0–0.1)
Eosinophils Relative: 1.6 % (ref 0.0–5.0)
Monocytes Absolute: 0.4 10*3/uL (ref 0.1–1.0)
Monocytes Relative: 6.3 % (ref 3.0–12.0)
Neutrophils Relative %: 68.9 % (ref 43.0–77.0)
Platelets: 242 10*3/uL (ref 150.0–400.0)
WBC: 5.8 10*3/uL (ref 4.5–10.5)

## 2012-07-19 LAB — BASIC METABOLIC PANEL
CO2: 28 mEq/L (ref 19–32)
Chloride: 103 mEq/L (ref 96–112)
Potassium: 3.9 mEq/L (ref 3.5–5.1)
Sodium: 141 mEq/L (ref 135–145)

## 2012-07-19 LAB — HEMOGLOBIN A1C: Hgb A1c MFr Bld: 5.9 % (ref 4.6–6.5)

## 2012-07-19 LAB — LIPID PANEL: Total CHOL/HDL Ratio: 3

## 2012-07-19 LAB — TSH: TSH: 2.56 u[IU]/mL (ref 0.35–5.50)

## 2012-11-21 ENCOUNTER — Encounter: Payer: Self-pay | Admitting: Internal Medicine

## 2012-11-21 ENCOUNTER — Ambulatory Visit (INDEPENDENT_AMBULATORY_CARE_PROVIDER_SITE_OTHER): Payer: Medicare Other | Admitting: Internal Medicine

## 2012-11-21 VITALS — BP 118/80 | HR 54 | Temp 98.0°F | Wt 168.0 lb

## 2012-11-21 DIAGNOSIS — R634 Abnormal weight loss: Secondary | ICD-10-CM

## 2012-11-21 DIAGNOSIS — M25529 Pain in unspecified elbow: Secondary | ICD-10-CM

## 2012-11-21 DIAGNOSIS — M79669 Pain in unspecified lower leg: Secondary | ICD-10-CM

## 2012-11-21 DIAGNOSIS — M79609 Pain in unspecified limb: Secondary | ICD-10-CM
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
## 2012-11-21 NOTE — Patient Instructions (Addendum)
Use an anti-inflammatory cream such as Aspercreme or Zostrix cream twice a day to the affected area as needed. In lieu of this warm moist compresses or  hot water bottle can be used with elevation. Do not apply ice to the areas.   To prevent trauma to the elbow; consider a protective elbow pad. A soft support for the shin may prevent soft tissue swelling with walking. Also instead of walking; you could consider stationary bike or treadmill exercise.

## 2013-01-05 ENCOUNTER — Ambulatory Visit: Payer: Medicare Other | Admitting: Internal Medicine

## 2013-02-03 ENCOUNTER — Other Ambulatory Visit: Payer: Self-pay

## 2013-04-17 ENCOUNTER — Other Ambulatory Visit: Payer: Self-pay

## 2013-04-17 DIAGNOSIS — Z1231 Encounter for screening mammogram for malignant neoplasm of breast: Secondary | ICD-10-CM

## 2013-05-16 ENCOUNTER — Telehealth: Payer: Self-pay | Admitting: *Deleted

## 2013-05-16 ENCOUNTER — Ambulatory Visit (INDEPENDENT_AMBULATORY_CARE_PROVIDER_SITE_OTHER): Payer: Medicare Other | Admitting: Internal Medicine

## 2013-05-16 ENCOUNTER — Ambulatory Visit (HOSPITAL_COMMUNITY)
Admission: RE | Admit: 2013-05-16 | Discharge: 2013-05-16 | Disposition: A | Discharge status: Home / Self Care | Payer: Medicare Other | Source: Ambulatory Visit | Attending: Internal Medicine | Admitting: Internal Medicine

## 2013-05-16 VITALS — BP 152/90 | HR 59 | Temp 98.1°F | Wt 183.0 lb

## 2013-05-16 DIAGNOSIS — F411 Generalized anxiety disorder: Secondary | ICD-10-CM

## 2013-05-16 DIAGNOSIS — R030 Elevated blood-pressure reading, without diagnosis of hypertension: Secondary | ICD-10-CM

## 2013-05-16 DIAGNOSIS — I10 Essential (primary) hypertension: Secondary | ICD-10-CM | POA: Insufficient documentation

## 2013-05-16 DIAGNOSIS — R51 Headache: Secondary | ICD-10-CM | POA: Insufficient documentation

## 2013-05-16 DIAGNOSIS — R42 Dizziness and giddiness: Secondary | ICD-10-CM | POA: Insufficient documentation

## 2013-05-16 DIAGNOSIS — R110 Nausea: Secondary | ICD-10-CM | POA: Insufficient documentation

## 2013-05-16 MED ORDER — AMLODIPINE BESYLATE 5 MG PO TABS: 5.0000 mg | ORAL_TABLET | Freq: Every day | ORAL | Status: DC

## 2013-05-16 MED ORDER — CLONAZEPAM 0.5 MG PO TABS: 0.5000 mg | ORAL_TABLET | Freq: Three times a day (TID) | ORAL | Status: DC | PRN

## 2013-05-16 NOTE — Patient Instructions (Addendum)
CT of the head today. Take amlodipine once a day for BP. Check the  blood pressure daily, be sure it is between 110/60 and 140/85. If it is consistently higher or lower, let me know, Call anytime if BP is more than 190/95. Take clonazepam 3 times a day for anxiety, will make you sleepy. Even if a CT of the head is normal, I recommend you to go to the ER if the headache is intense, you have more nausea See your primary Dr. In one week.
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
## 2013-05-17 ENCOUNTER — Encounter: Payer: Self-pay | Admitting: Internal Medicine

## 2013-05-17 ENCOUNTER — Encounter: Payer: Self-pay | Admitting: *Deleted

## 2013-05-17 LAB — BASIC METABOLIC PANEL
GFR: 92.43 mL/min (ref >60.00)
Glucose, Bld: 98 mg/dL (ref 70–99)
Potassium: 3.9 mEq/L (ref 3.5–5.1)
Sodium: 135 mEq/L (ref 135–145)

## 2013-05-17 LAB — CBC WITH DIFFERENTIAL/PLATELET
Basophils Absolute: 0 10*3/uL (ref 0.0–0.1)
Basophils Relative: 0.1 % (ref 0.0–3.0)
Eosinophils Relative: 0.2 % (ref 0.0–5.0)
HCT: 44.1 % (ref 36.0–46.0)
Hemoglobin: 14.8 g/dL (ref 12.0–15.0)
Lymphs Abs: 1 10*3/uL (ref 0.7–4.0)
Monocytes Relative: 4.3 % (ref 3.0–12.0)
Neutro Abs: 6.5 10*3/uL (ref 1.4–7.7)
RBC: 5.41 Mil/uL — ABNORMAL HIGH (ref 3.87–5.11)
RDW: 13.1 % (ref 11.5–14.6)

## 2013-05-22 ENCOUNTER — Encounter: Payer: Self-pay | Admitting: Lab

## 2013-05-23 ENCOUNTER — Encounter: Payer: Self-pay | Admitting: Internal Medicine

## 2013-05-23 ENCOUNTER — Ambulatory Visit (INDEPENDENT_AMBULATORY_CARE_PROVIDER_SITE_OTHER): Payer: Medicare Other | Admitting: Internal Medicine

## 2013-05-23 VITALS — BP 142/86 | HR 82 | Temp 98.5°F | Wt 189.0 lb

## 2013-05-23 DIAGNOSIS — I10 Essential (primary) hypertension: Secondary | ICD-10-CM | POA: Insufficient documentation

## 2013-05-23 NOTE — Progress Notes (Signed)
  Subjective:    Patient ID: Abigail Long, female    DOB: 1941-08-24, 72 y.o.   MRN: [id number]  HPI   Headaches have resolved with improvement in blood pressure control. Since she's been on the amlodipine 5 mg ; blood pressures range from 109-150 with a diastolic blood pressure generally around 80.    Review of Systems  She also denies epistaxis, exertional chest pain, dyspnea on exertion, paroxysmal nocturnal dyspnea, claudication, or pedal edema.     Objective:   Physical Exam Appears healthy and well-nourished & in no acute distress.Appears younger than stated age  No carotid bruits are present.No neck pain distention present at 10 - 15 degrees. Thyroid normal to palpation  Heart rhythm and rate are normal with no significant murmurs or gallops.S4  Chest is clear with no increased work of breathing  There is no evidence of aortic aneurysm or renal artery bruits  Abdomen soft with no organomegaly or masses. No HJR  No clubbing, cyanosis or edema present.  Pedal pulses are intact   No ischemic skin changes are present . Nails healthy    Alert and oriented. Strength, tone          Assessment & Plan:  See Current Assessment & Plan in Problem List under specific Diagnosis

## 2013-05-23 NOTE — Patient Instructions (Addendum)

## 2013-05-28 ENCOUNTER — Ambulatory Visit
Admission: RE | Admit: 2013-05-28 | Discharge: 2013-05-28 | Disposition: A | Discharge status: Home / Self Care | Payer: Medicare Other | Source: Ambulatory Visit

## 2013-05-28 DIAGNOSIS — Z1231 Encounter for screening mammogram for malignant neoplasm of breast: Secondary | ICD-10-CM

## 2013-07-03 ENCOUNTER — Encounter: Payer: Self-pay | Admitting: Internal Medicine

## 2013-07-03 ENCOUNTER — Ambulatory Visit (INDEPENDENT_AMBULATORY_CARE_PROVIDER_SITE_OTHER): Payer: Medicare Other | Admitting: Internal Medicine

## 2013-07-03 VITALS — BP 126/80 | HR 73 | Wt 194.0 lb

## 2013-07-03 DIAGNOSIS — I10 Essential (primary) hypertension: Secondary | ICD-10-CM

## 2013-07-03 MED ORDER — AMLODIPINE BESYLATE 5 MG PO TABS: 5.0000 mg | ORAL_TABLET | Freq: Every day | ORAL | Status: DC

## 2013-07-03 NOTE — Assessment & Plan Note (Signed)
No change 

## 2013-07-03 NOTE — Progress Notes (Signed)
  Subjective:    Patient ID: Abigail Long, female    DOB: [birth year], 72 y.o.   MRN: [id number]  HPI CHRONIC HYPERTENSION follow-up:  Home blood pressure average 128/78; range 119/62-146/97  Patient is compliant with medications  No adverse effects noted from medication  Exercise program as Silver [employer]neakers 1 hr/week  No specific dietary program        Review of Systems No chest pain, palpitations, dyspnea, claudication,edema or paroxysmal nocturnal dyspnea described  No significant lightheadedness, headache, epistaxis, or syncope        Objective:   Physical Exam  Appears healthy and well-nourished & in no acute distress  No carotid bruits are present.No neck pain distention present at 10 - 15 degrees. Thyroid normal to palpation  Heart rhythm and rate are normal with Grade 1/6 systolic murmur  Chest is clear with no increased work of breathing  There is no evidence of aortic aneurysm or renal artery bruits  Abdomen soft with no organomegaly or masses. No HJR  No clubbing, cyanosis or edema present.  Pedal pulses are intact   No ischemic skin changes are present . Nails  Healthy   Alert and oriented. Strength, tone normal         Assessment & Plan:  #1 HTN  No change

## 2013-07-03 NOTE — Patient Instructions (Addendum)
Minimal Blood Pressure Goal= AVERAGE < 140/90;  Ideal is an AVERAGE < 135/85. This AVERAGE should be calculated from @ least 5-7 BP readings taken @ different times of day on different days of week. You should not respond to isolated BP readings , but rather the AVERAGE for that week .Please bring your  blood pressure cuff to office visits to verify that it is reliable.It  can also be checked against the blood pressure device at the pharmacy. Finger or wrist cuffs are not dependable; an arm cuff is. Eat a low-fat diet with lots of fruits and vegetables, up to 7-9 servings per day. Consume less than  30  Grams (preferably ZERO) of sugar per day from foods & drinks with High Fructose Corn Syrup (HFCS) sugar as #1,2,3 or # 4 on label.Whole [employer], [employer] & [employer] do not carry products with HFCS. Follow a  low carb nutrition program such as [place] or [employer]  to prevent Diabetes  . White carbohydrates (potatoes, rice, bread, and pasta) have a high spike of sugar and a high load of sugar. For example a  baked potato has a cup of sugar and a  french fry  2 teaspoons of sugar. Yams, wild  rice, whole grained bread &  wheat pasta have been much lower spike and load of  sugar. Portions should be the size of a deck of cards or your palm. Cardiovascular exercise, this can be as simple a program as walking, is recommended 30-45 minutes 3-4 times per week. If you're not exercising you should take 6-8 weeks to build up to this level.

## 2013-07-05 ENCOUNTER — Other Ambulatory Visit: Payer: Self-pay | Admitting: Internal Medicine

## 2013-07-24 ENCOUNTER — Encounter: Payer: Self-pay | Admitting: Internal Medicine

## 2013-07-24 ENCOUNTER — Ambulatory Visit (INDEPENDENT_AMBULATORY_CARE_PROVIDER_SITE_OTHER): Payer: Medicare Other | Admitting: Internal Medicine

## 2013-07-24 VITALS — BP 124/80 | HR 67 | Temp 98.4°F | Resp 12 | Ht 65.0 in | Wt 192.0 lb

## 2013-07-24 DIAGNOSIS — Z0000 Encounter for general adult medical examination without abnormal findings: Secondary | ICD-10-CM

## 2013-07-24 DIAGNOSIS — H6120 Impacted cerumen, unspecified ear: Secondary | ICD-10-CM

## 2013-07-24 DIAGNOSIS — I10 Essential (primary) hypertension: Secondary | ICD-10-CM

## 2013-07-24 DIAGNOSIS — E785 Hyperlipidemia, unspecified: Secondary | ICD-10-CM

## 2013-07-24 DIAGNOSIS — R05 Cough: Secondary | ICD-10-CM

## 2013-07-24 DIAGNOSIS — H6123 Impacted cerumen, bilateral: Secondary | ICD-10-CM

## 2013-07-24 NOTE — Progress Notes (Signed)
Subjective:    Patient ID: Abigail Long, female    DOB: 04/20/1941, 72 y.o.   MRN: [id number]  HPI  Medicare Wellness Visit:  Psychosocial & medical history were reviewed as required by Medicare (abuse,antisocial behavioral risks,firearm risk).  Social history: caffeine:no  , alcohol: no  ,  tobacco use: no   Exercise : see below  Home & personal  safety / fall risk:no Limitations of activities of daily living:no Seatbelt  and smoke alarm use:yes Power of Attorney/Living Will status : needed Ophthalmology exam status : current Hearing evaluation status:not current Orientation :oriented X 3 Memory & recall : good Spelling  testing: good Active depression / anxiety:denied Foreign travel history : never  Immunization status for Shingles /Flu/ PNA/ tetanus : current Transfusion history: no Preventive health surveillance status of colonoscopy, BMD , mammograms,PAP as per protocol/ SOC: current Dental care:  Every 12 mos Chart reviewed &  Updated. Active issues reviewed & addressed.      Review of Systems CHRONIC HYPERTENSION follow-up: Home blood pressure average 120+/80+ Patient is compliant with medications No adverse effects noted from medication Exercise program @ [employer]  as walking 1 mpd 2-3  times per week . No specific dietary program except low-salt diet. No chest pain, palpitations, dyspnea, claudication,edema or paroxysmal nocturnal dyspnea described. No significant lightheadedness, headache, epistaxis, or syncope. Cough in context of PNDrainage       Objective:   Physical Exam Gen.: well-nourished in appearance. Alert, appropriate and cooperative throughout exam. Appears younger than stated age  Head: Normocephalic without obvious abnormalities Eyes: No corneal or conjunctival inflammation noted. Pupils equal round reactive to light and accommodation. Fundal exam is benign without hemorrhages, exudate, papilledema. Extraocular motion intact. Vision grossly  normal with lenses Ears: External  ear exam reveals no significant lesions or deformities. Wax bilaterally but hearing is grossly normal bilaterally. Nose: External nasal exam reveals no deformity or inflammation. Nasal mucosa are pink and moist. No lesions or exudates noted.  Mouth: Oral mucosa and oropharynx reveal no lesions or exudates. Teeth in good repair. Neck: No deformities, masses, or tenderness noted. Range of motion & Thyroid normal. Lungs: Normal respiratory effort; chest expands symmetrically. Lungs are clear to auscultation without rales, wheezes, or increased work of breathing. Heart: Normal rate and rhythm. Normal S1 and S2. No gallop, click, or rub. S4 with slurring; no murmur. Abdomen: Bowel sounds normal; abdomen soft and nontender. No masses, organomegaly or hernias noted. Genitalia: As per Gyn                                  Musculoskeletal/extremities: Accentuated curvature of upper thoracic  Spine. No clubbing, cyanosis, edema, or significant extremity  deformity noted. Range of motion normal .Tone & strength  Normal. Joints  reveal minor DIP changes. Nail health good. Able to lie down & sit up w/o help. Negative SLR bilaterally Vascular: Carotid, radial artery, dorsalis pedis and  posterior tibial pulses are full and equal. No bruits present. Neurologic: Alert and oriented x3. Deep tendon reflexes symmetrical and normal.  Gait normal  including heel & toe walking .        Skin: Intact without suspicious lesions or rashes. Lymph: No cervical, axillary lymphadenopathy present. Psych: Mood and affect are normal. Normally interactive  Assessment & Plan:  #1 Medicare Wellness Exam; criteria met ; data entered #2 Problem List/Diagnoses reviewed. #3 HTN controlled #4 cough from PNDrainage #5 cerumen impactions Plan:  Assessments made/ Orders entered

## 2013-07-24 NOTE — Patient Instructions (Addendum)
The best protocol to address wax build up is : Do not use Q-tips as this simply packs wax deeper in the ear canal.  Put 2-3 drops of  mineral oil in the affected  ear at night to soften the wax .Cover the canal with a  cotton ball to prevent the oil from staining bed linens. In the morning fill the ear canal with hydrogen peroxide & lie on  the side opposite the affected ear for 10-15 minutes. After allowing this period of time for the peroxide to dissolve the wax ;shower and use the thinnest washrag available to wick out the wax. If both ears are involved ; alternate this treatment from ear to ear each night until no wax is found on the washrag. Plain Mucinex (NOT D) for thick secretions ;force NON dairy fluids .   Nasal cleansing in the shower as discussed with lather of mild shampoo.After 10 seconds wash off lather while  exhaling through nostrils. Make sure that all residual soap is removed to prevent irritation.  Fluticasone 1 spray in each nostril twice a day as needed. Use the "crossover" technique into opposite nostril spraying toward opposite ear @ 45 degree angle, not straight up into nostril.  Use a Neti pot daily only  as needed for significant sinus congestion; going from open side to congested side . Plain Allegra (NOT D )  160 daily , Loratidine 10 mg , OR Zyrtec 10 mg @ bedtime  as needed for itchy eyes & sneezing.

## 2013-07-25 LAB — LDL CHOLESTEROL, DIRECT: Direct LDL: 145.8 mg/dL

## 2013-07-25 LAB — HEPATIC FUNCTION PANEL
ALT: 22 U/L (ref 0–35)
AST: 24 U/L (ref 0–37)
Albumin: 4.5 g/dL (ref 3.5–5.2)
Alkaline Phosphatase: 49 U/L (ref 39–117)
Total Protein: 7.5 g/dL (ref 6.0–8.3)

## 2013-07-25 LAB — LIPID PANEL: Cholesterol: 220 mg/dL — ABNORMAL HIGH (ref 0–200)

## 2013-10-25 ENCOUNTER — Other Ambulatory Visit: Payer: Self-pay

## 2014-05-14 ENCOUNTER — Other Ambulatory Visit: Payer: Self-pay

## 2014-05-14 DIAGNOSIS — Z1231 Encounter for screening mammogram for malignant neoplasm of breast: Secondary | ICD-10-CM

## 2014-05-29 ENCOUNTER — Ambulatory Visit
Admission: RE | Admit: 2014-05-29 | Discharge: 2014-05-29 | Disposition: A | Discharge status: Home / Self Care | Payer: Medicare Other | Source: Ambulatory Visit

## 2014-05-29 DIAGNOSIS — Z1231 Encounter for screening mammogram for malignant neoplasm of breast: Secondary | ICD-10-CM

## 2014-07-19 ENCOUNTER — Other Ambulatory Visit: Payer: Self-pay | Admitting: Internal Medicine

## 2014-08-19 ENCOUNTER — Other Ambulatory Visit (INDEPENDENT_AMBULATORY_CARE_PROVIDER_SITE_OTHER): Payer: Medicare Other

## 2014-08-19 ENCOUNTER — Encounter: Payer: Self-pay | Admitting: Internal Medicine

## 2014-08-19 ENCOUNTER — Ambulatory Visit (INDEPENDENT_AMBULATORY_CARE_PROVIDER_SITE_OTHER): Payer: Medicare Other | Admitting: Internal Medicine

## 2014-08-19 VITALS — BP 140/80 | HR 58 | Temp 98.3°F | Ht 63.5 in | Wt 196.4 lb

## 2014-08-19 DIAGNOSIS — M899 Disorder of bone, unspecified: Secondary | ICD-10-CM

## 2014-08-19 DIAGNOSIS — E785 Hyperlipidemia, unspecified: Secondary | ICD-10-CM

## 2014-08-19 DIAGNOSIS — Z0000 Encounter for general adult medical examination without abnormal findings: Secondary | ICD-10-CM

## 2014-08-19 DIAGNOSIS — M949 Disorder of cartilage, unspecified: Secondary | ICD-10-CM

## 2014-08-19 DIAGNOSIS — I10 Essential (primary) hypertension: Secondary | ICD-10-CM

## 2014-08-19 LAB — HEPATIC FUNCTION PANEL
ALBUMIN: 4.3 g/dL (ref 3.5–5.2)
ALK PHOS: 44 U/L (ref 39–117)
ALT: 26 U/L (ref 0–35)
AST: 31 U/L (ref 0–37)
Bilirubin, Direct: 0.1 mg/dL (ref 0.0–0.3)
TOTAL PROTEIN: 7.6 g/dL (ref 6.0–8.3)
Total Bilirubin: 0.8 mg/dL (ref 0.2–1.2)

## 2014-08-19 LAB — LIPID PANEL
CHOLESTEROL: 162 mg/dL (ref 0–200)
HDL: 57.7 mg/dL (ref >39.00)
LDL Cholesterol: 93 mg/dL (ref 0–99)
NonHDL: 104.3
Total CHOL/HDL Ratio: 3
Triglycerides: 58 mg/dL (ref 0.0–149.0)
VLDL: 11.6 mg/dL (ref 0.0–40.0)

## 2014-08-19 LAB — BASIC METABOLIC PANEL
BUN: 14 mg/dL (ref 6–23)
CO2: 28 mEq/L (ref 19–32)
Calcium: 9.8 mg/dL (ref 8.4–10.5)
Chloride: 104 mEq/L (ref 96–112)
Creatinine, Ser: 0.8 mg/dL (ref 0.4–1.2)
GFR: 71.93 mL/min (ref >60.00)
GLUCOSE: 99 mg/dL (ref 70–99)
Potassium: 4.3 mEq/L (ref 3.5–5.1)
SODIUM: 140 meq/L (ref 135–145)

## 2014-08-19 LAB — VITAMIN D 25 HYDROXY (VIT D DEFICIENCY, FRACTURES): VITD: 56.42 ng/mL (ref 30.00–100.00)

## 2014-08-19 LAB — TSH: TSH: 2.73 u[IU]/mL (ref 0.35–4.50)

## 2014-08-19 NOTE — Assessment & Plan Note (Signed)
Lipids, LFTs, TSH  

## 2014-08-19 NOTE — Assessment & Plan Note (Signed)
Vitamin D level 

## 2014-08-19 NOTE — Assessment & Plan Note (Signed)
Blood pressure goals reviewed. BMET 
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
## 2014-08-19 NOTE — Progress Notes (Signed)
Pre visit review using our clinic review tool, if applicable. No additional management support is needed unless otherwise documented below in the visit note. 

## 2014-08-19 NOTE — Patient Instructions (Signed)

## 2014-08-29 ENCOUNTER — Telehealth: Payer: Self-pay | Admitting: *Deleted

## 2014-08-29 NOTE — Telephone Encounter (Signed)
Left msg on triage requesting lab results. Called pt back inform her md release to mychart. Pt states she doesn't have a computer. Gave md response, pt is also wanting copy mail to her. Inform pt will mail...Johny Chess

## 2014-09-18 ENCOUNTER — Other Ambulatory Visit: Payer: Self-pay | Admitting: Internal Medicine

## 2015-01-08 DIAGNOSIS — L820 Inflamed seborrheic keratosis: Secondary | ICD-10-CM | POA: Diagnosis not present

## 2015-01-08 DIAGNOSIS — L821 Other seborrheic keratosis: Secondary | ICD-10-CM | POA: Diagnosis not present

## 2015-01-08 DIAGNOSIS — D1801 Hemangioma of skin and subcutaneous tissue: Secondary | ICD-10-CM | POA: Diagnosis not present

## 2015-01-17 DIAGNOSIS — R300 Dysuria: Secondary | ICD-10-CM | POA: Diagnosis not present

## 2015-03-26 ENCOUNTER — Other Ambulatory Visit: Payer: Self-pay | Admitting: Internal Medicine

## 2015-05-03 DIAGNOSIS — H5203 Hypermetropia, bilateral: Secondary | ICD-10-CM | POA: Diagnosis not present

## 2015-07-03 ENCOUNTER — Encounter: Payer: Self-pay | Admitting: Internal Medicine

## 2015-07-30 LAB — CBC AND DIFFERENTIAL
HCT: 41 % (ref 36–46)
Hemoglobin: 13.2 g/dL (ref 12.0–16.0)
Platelets: 279 10*3/uL (ref 150–399)
WBC: 5.2 10*3/mL

## 2015-07-30 LAB — HEPATIC FUNCTION PANEL
AST: 22 U/L (ref 13–35)
Alkaline Phosphatase: 53 U/L (ref 25–125)
BILIRUBIN, TOTAL: 0.3 mg/dL

## 2015-07-30 LAB — TSH: TSH: 4.57 u[IU]/mL (ref 0.41–5.90)

## 2015-07-30 LAB — BASIC METABOLIC PANEL
Creatinine: 0.8 mg/dL (ref 0.5–1.1)
Glucose: 96 mg/dL
Potassium: 4.7 mmol/L (ref 3.4–5.3)
SODIUM: 139 mmol/L (ref 137–147)

## 2015-08-15 ENCOUNTER — Ambulatory Visit (INDEPENDENT_AMBULATORY_CARE_PROVIDER_SITE_OTHER): Payer: Medicare Other | Admitting: Internal Medicine

## 2015-08-15 ENCOUNTER — Encounter: Payer: Self-pay | Admitting: Internal Medicine

## 2015-08-15 VITALS — BP 130/80 | HR 67 | Temp 98.0°F | Resp 18 | Wt 192.0 lb

## 2015-08-15 DIAGNOSIS — R1032 Left lower quadrant pain: Secondary | ICD-10-CM

## 2015-08-15 DIAGNOSIS — M542 Cervicalgia: Secondary | ICD-10-CM | POA: Diagnosis not present

## 2015-08-15 DIAGNOSIS — R7989 Other specified abnormal findings of blood chemistry: Secondary | ICD-10-CM

## 2015-08-15 DIAGNOSIS — R946 Abnormal results of thyroid function studies: Secondary | ICD-10-CM | POA: Diagnosis not present
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
## 2015-08-15 NOTE — Patient Instructions (Addendum)
  Please remove any unnecessary items from your purse and alternate the shoulder on which you carry a purse. Use an anti-inflammatory cream such as Aspercreme or Zostrix cream or the Bio freeze spray twice a day to the affected area as needed. In lieu of this warm moist compresses or  hot water bottle can be used. Do not apply ice .  If the neck pain persists; physical therapy or massage therapy would be recommended.  We will attempt to get the thyroid function test results and make recommendations to you based on those values.   Plain Mucinex (NOT D) for thick secretions ;force NON dairy fluids .   Nasal cleansing in the shower as discussed with lather of mild shampoo.After 10 seconds wash off lather while  exhaling through nostrils. Make sure that all residual soap is removed to prevent irritation.  Flonase OR Nasacort AQ 1 spray in each nostril twice a day as needed. Use the "crossover" technique into opposite nostril spraying toward opposite ear @ 45 degree angle, not straight up into nostril.  Plain Allegra (NOT D )  160 daily , Loratidine 10 mg , OR Zyrtec 10 mg @ bedtime  as needed for itchy eyes & sneezing.

## 2015-08-15 NOTE — Progress Notes (Signed)
Pre visit review using our clinic review tool, if applicable. No additional management support is needed unless otherwise documented below in the visit note. 

## 2015-08-16 ENCOUNTER — Encounter: Payer: Self-pay | Admitting: Internal Medicine

## 2015-08-16 ENCOUNTER — Other Ambulatory Visit: Payer: Self-pay | Admitting: Internal Medicine

## 2015-08-16 DIAGNOSIS — R7989 Other specified abnormal findings of blood chemistry: Secondary | ICD-10-CM

## 2015-08-21 ENCOUNTER — Other Ambulatory Visit (INDEPENDENT_AMBULATORY_CARE_PROVIDER_SITE_OTHER): Payer: Medicare Other

## 2015-08-21 ENCOUNTER — Ambulatory Visit (INDEPENDENT_AMBULATORY_CARE_PROVIDER_SITE_OTHER): Payer: Medicare Other | Admitting: Internal Medicine

## 2015-08-21 ENCOUNTER — Encounter: Payer: Self-pay | Admitting: Internal Medicine

## 2015-08-21 VITALS — BP 132/80 | HR 60 | Temp 97.9°F | Resp 16 | Wt 186.0 lb

## 2015-08-21 DIAGNOSIS — M8580 Other specified disorders of bone density and structure, unspecified site: Secondary | ICD-10-CM | POA: Diagnosis not present

## 2015-08-21 DIAGNOSIS — F32A Depression, unspecified: Secondary | ICD-10-CM

## 2015-08-21 DIAGNOSIS — E785 Hyperlipidemia, unspecified: Secondary | ICD-10-CM

## 2015-08-21 DIAGNOSIS — F329 Major depressive disorder, single episode, unspecified: Secondary | ICD-10-CM

## 2015-08-21 DIAGNOSIS — R7989 Other specified abnormal findings of blood chemistry: Secondary | ICD-10-CM

## 2015-08-21 DIAGNOSIS — R351 Nocturia: Secondary | ICD-10-CM | POA: Diagnosis not present

## 2015-08-21 DIAGNOSIS — Z0000 Encounter for general adult medical examination without abnormal findings: Secondary | ICD-10-CM

## 2015-08-21 DIAGNOSIS — I10 Essential (primary) hypertension: Secondary | ICD-10-CM

## 2015-08-21 DIAGNOSIS — R946 Abnormal results of thyroid function studies: Secondary | ICD-10-CM | POA: Diagnosis not present

## 2015-08-21 LAB — URINALYSIS
Bilirubin Urine: NEGATIVE
Hgb urine dipstick: NEGATIVE
Leukocytes, UA: NEGATIVE
Nitrite: NEGATIVE
PH: 5.5 (ref 5.0–8.0)
SPECIFIC GRAVITY, URINE: 1.01 (ref 1.000–1.030)
TOTAL PROTEIN, URINE-UPE24: NEGATIVE
Urine Glucose: NEGATIVE
Urobilinogen, UA: 0.2 (ref 0.0–1.0)

## 2015-08-21 NOTE — Assessment & Plan Note (Signed)
Blood pressure goals reviewed. BMET from bariatric clinic reviewed

## 2015-08-21 NOTE — Assessment & Plan Note (Signed)
Recheck full thyroid function test after 10 weeks. No intervention indicated with this minimal elevation

## 2015-08-21 NOTE — Progress Notes (Signed)
Pre visit review using our clinic review tool, if applicable. No additional management support is needed unless otherwise documented below in the visit note. 

## 2015-08-21 NOTE — Assessment & Plan Note (Signed)
Results from the bariatric clinic were reviewed; no changes indicated

## 2015-08-21 NOTE — Patient Instructions (Signed)
Please do not use Q-tips as this simply packs the wax down against he eardrum. Should wax build up occur, please put 2-3 drops of mineral oil in the ear at night and cover the canal with a  cotton ball.In the morning fill the canal with hydrogen peroxide & leave  for 10-15 minutes.Following this shower and use the thinnest washrag available to wick out the wax.   Please have the thyroid function tests repeated 10 weeks from the date these were done at the bariatric clinic.

## 2015-08-21 NOTE — Assessment & Plan Note (Signed)
Therapeutic options discussed; medicines held at this time
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
## 2015-08-21 NOTE — Assessment & Plan Note (Signed)
She is to verify when the follow-up BMD is indicated

## 2015-09-02 ENCOUNTER — Other Ambulatory Visit: Payer: Self-pay | Admitting: Obstetrics & Gynecology

## 2015-09-02 DIAGNOSIS — Z780 Asymptomatic menopausal state: Secondary | ICD-10-CM

## 2015-09-02 DIAGNOSIS — E2839 Other primary ovarian failure: Secondary | ICD-10-CM

## 2015-10-08 ENCOUNTER — Other Ambulatory Visit: Payer: Self-pay | Admitting: Emergency Medicine

## 2015-10-08 ENCOUNTER — Encounter: Payer: Self-pay | Admitting: Family Medicine

## 2015-10-08 ENCOUNTER — Ambulatory Visit (INDEPENDENT_AMBULATORY_CARE_PROVIDER_SITE_OTHER)
Admission: RE | Admit: 2015-10-08 | Discharge: 2015-10-08 | Disposition: A | Discharge status: Home / Self Care | Payer: Medicare Other | Source: Ambulatory Visit | Attending: Family Medicine | Admitting: Family Medicine

## 2015-10-08 ENCOUNTER — Other Ambulatory Visit: Payer: Self-pay | Admitting: Internal Medicine

## 2015-10-08 ENCOUNTER — Other Ambulatory Visit (INDEPENDENT_AMBULATORY_CARE_PROVIDER_SITE_OTHER): Payer: Medicare Other

## 2015-10-08 ENCOUNTER — Ambulatory Visit (INDEPENDENT_AMBULATORY_CARE_PROVIDER_SITE_OTHER): Payer: Medicare Other | Admitting: Family Medicine

## 2015-10-08 VITALS — BP 134/84 | HR 66 | Ht 63.5 in | Wt 182.0 lb

## 2015-10-08 DIAGNOSIS — M25561 Pain in right knee: Secondary | ICD-10-CM

## 2015-10-08 DIAGNOSIS — M129 Arthropathy, unspecified: Secondary | ICD-10-CM | POA: Diagnosis not present

## 2015-10-08 DIAGNOSIS — R7989 Other specified abnormal findings of blood chemistry: Secondary | ICD-10-CM

## 2015-10-08 DIAGNOSIS — IMO0002 Reserved for concepts with insufficient information to code with codable children: Secondary | ICD-10-CM

## 2015-10-08 DIAGNOSIS — R946 Abnormal results of thyroid function studies: Secondary | ICD-10-CM | POA: Diagnosis not present

## 2015-10-08 LAB — T4, FREE: Free T4: 0.79 ng/dL (ref 0.60–1.60)

## 2015-10-08 LAB — TSH: TSH: 2.51 u[IU]/mL (ref 0.35–4.50)

## 2015-10-08 LAB — T3, FREE: T3 FREE: 3.2 pg/mL (ref 2.3–4.2)

## 2015-10-08 MED ORDER — AMLODIPINE BESYLATE 5 MG PO TABS: 5.0000 mg | ORAL_TABLET | Freq: Every day | ORAL | Status: DC

## 2015-10-08 NOTE — Progress Notes (Signed)
Pre visit review using our clinic review tool, if applicable. No additional management support is needed unless otherwise documented below in the visit note. 

## 2015-10-08 NOTE — Patient Instructions (Addendum)
Good to see you.  Ice 20 minutes 2 times daily. Usually after activity and before bed. Exercises 3 times a week.  Take tylenol 500 mg three times a day is the best evidence based medicine we have for arthritis.  Glucosamine sulfate 1500mg  a day is a supplement that has been shown to help moderate to severe arthritis. Vitamin D 2000 IU daily Fish oil 2 grams daily.  Tumeric 500mg  twice daily.  Capsaicin topically up to four times a day may also help with pain. Cortisone injections are an option if these interventions do not seem to make a difference or need more relief.  If cortisone injections do not help, there are different types of shots that may help but they take longer to take effect.  We can discuss this at follow up.  It's important that you continue to stay active. Controlling your weight is important.  Consider physical therapy to strengthen muscles around the joint that hurts to take pressure off of the joint itself. Water aerobics and cycling with low resistance are the best two types of exercise for arthritis. Come back and see me in 3-4 weeks.

## 2015-10-08 NOTE — Assessment & Plan Note (Signed)
Patient does have some lower extremity arthritis is likely contributing but the degenerative meniscal tear is likely contributed to the most acute pain the patient is having at this time. We discussed home exercises and patient work with Product/process development scientist. Patient will try topical anti-inflammatories and over-the-counter natural supplementations. Patient and will come back and see me again in 3-4 weeks. X-rays ordered today to further evaluate for other bony abnormality as well as the degree of arthritis. Patient will come back and see me and if continuing have pain we will consider injection as well as formal physical therapy.

## 2015-10-08 NOTE — Progress Notes (Signed)
[facility] [address], [place] [address] Phone: [phone] Subjective:    I'm seeing this patient by the request  of:  Unice Cobble, MD   CC: right knee pain  JAS:[id number] Abigail Long is a 74 y.o. female coming in with complaint of  Right knee pain. Patient states that she was attempting to get up from a seated position and unfortunately had a severe pain in her knee that seemed to go from the medial to lateral aspect of the knee. Denies any swelling. Since and though has some discomfort especially with walking. Going up and down stairs can be difficult. Seems to be on the superior lateral aspect of the knee. Sometimes can hurt on the medial aspect of the knee as well. Denies any significant instability. Seems to have not improved over the course of the last several weeks. Patient has strides some icing and was told by primary care provider that it was likely arthritis. Patient is wondering what else can be done in for further evaluation. Denies any radiation down the legs any numbness or tingling. Denies any significant weakness. Denies any nighttime awakening.  Past Medical History  Diagnosis Date  . Hypertension    Past Surgical History  Procedure Laterality Date  . Tonsillectomy    . Dilation and curettage of uterus    . Colonoscopy  2008     Negative,Dr Henrene Pastor  . Tooth extraction      right side of mouth and implant placed    Social History  Substance Use Topics  . Smoking status: Never Smoker   . Smokeless tobacco: Never Used  . Alcohol Use: No   No Known Allergies Family History  Problem Relation Age of Onset  . Heart disease Father 3    CBAG X 3; pacer  . Hypertension Mother   . Cancer Maternal Grandfather     renal  . Cancer Paternal Grandfather     intra- abdominal  . Diabetes Neg Hx   . Hyperlipidemia Neg Hx   . Stroke Maternal Aunt 85  . Bipolar disorder Son         Past medical history, social, surgical  and family history all reviewed in electronic medical record.   Review of Systems: No headache, visual changes, nausea, vomiting, diarrhea, constipation, dizziness, abdominal pain, skin rash, fevers, chills, night sweats, weight loss, swollen lymph nodes, body aches, joint swelling, muscle aches, chest pain, shortness of breath, mood changes.   Objective Blood pressure 134/84, pulse 66, height 5' 3.5" (1.613 m), weight 182 lb (82.555 kg), SpO2 97 %.  General: No apparent distress alert and oriented x3 mood and affect normal, dressed appropriately.  HEENT: Pupils equal, extraocular movements intact  Respiratory: Patient's speak in full sentences and does not appear short of breath  Cardiovascular: No lower extremity edema, non tender, no erythema  Skin: Warm dry intact with no signs of infection or rash on extremities or on axial skeleton.  Abdomen: Soft nontender  Neuro: Cranial nerves II through XII are intact, neurovascularly intact in all extremities with 2+ DTRs and 2+ pulses.  Lymph: No lymphadenopathy of posterior or anterior cervical chain or axillae bilaterally.  Gait normal with good balance and coordination.  MSK:  Non tender with full range of motion and good stability and symmetric strength and tone of shoulders, elbows, wrist, hip, and ankles bilaterally. Mild to moderate osteophytic changes of multiple joints. Knee:right Mild varus deformity of the knee noted Tender to  palpation over the patellofemoral joint as well as the medial joint space ROM full in flexion and extension and lower leg rotation. Ligaments with solid consistent endpoints including ACL, PCL, LCL, MCL. positiveMcmurray's, Apley's, and Thessalonian tests. Non painful patellar compression. Patellar glide without crepitus. Patellar and quadriceps tendons unremarkable. Hamstring and quadriceps strength is normal.  Contralateral knee also has some varus deformity but no pain.  MSK US performed of: rightknee This  study was ordered, performed, and interpreted by Charlann Boxer D.O.  Knee: All structures visualized.Trace effusion of the superior patellar space noted Moderate narrowing of the patellofemoral joint as well as medial joint space. Degenerative changes of the lateral and medial meniscus. Patellar Tendon unremarkable on long and transverse views without effusion. No abnormality of prepatellar bursa. LCL and MCL unremarkable on long and transverse views. No abnormality of origin of medial or lateral head of the gastrocnemius.  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  This included exercises focusing on stretching, strengthening, with significant focus on eccentric aspects.  Patient given flexion and extension exercises working on the vastus medialis oblique muscle as well as stability of the  Hip abductors. Discussed eccentric's and strengthening of the hamstrings and stretching of the quadriceps. Proper technique shown and discussed handout in great detail with ATC.  All questions were discussed and answered.     Impression and Recommendations:     This case required medical decision making of moderate complexity.

## 2015-10-17 ENCOUNTER — Ambulatory Visit
Admission: RE | Admit: 2015-10-17 | Discharge: 2015-10-17 | Disposition: A | Discharge status: Home / Self Care | Payer: Medicare Other | Source: Ambulatory Visit | Attending: Obstetrics & Gynecology | Admitting: Obstetrics & Gynecology

## 2015-10-17 DIAGNOSIS — E2839 Other primary ovarian failure: Secondary | ICD-10-CM

## 2015-10-17 DIAGNOSIS — Z780 Asymptomatic menopausal state: Secondary | ICD-10-CM

## 2015-10-17 DIAGNOSIS — M8588 Other specified disorders of bone density and structure, other site: Secondary | ICD-10-CM | POA: Diagnosis not present

## 2015-11-06 ENCOUNTER — Ambulatory Visit: Payer: Medicare Other | Admitting: Family Medicine

## 2015-11-06 ENCOUNTER — Ambulatory Visit (INDEPENDENT_AMBULATORY_CARE_PROVIDER_SITE_OTHER): Payer: Medicare Other | Admitting: Family Medicine

## 2015-11-06 ENCOUNTER — Encounter: Payer: Self-pay | Admitting: Family Medicine

## 2015-11-06 VITALS — BP 124/72 | HR 66 | Ht 63.5 in | Wt 178.0 lb

## 2015-11-06 DIAGNOSIS — M129 Arthropathy, unspecified: Secondary | ICD-10-CM

## 2015-11-06 DIAGNOSIS — IMO0002 Reserved for concepts with insufficient information to code with codable children: Secondary | ICD-10-CM

## 2015-11-06 NOTE — Patient Instructions (Signed)
Great to see you You are doing great Continue the exercises 2 times a week Stay active Continue the vitamins Happy holidays! See me when you need me.

## 2015-11-06 NOTE — Progress Notes (Signed)
[facility] [address] [place], [place] [address] Phone: [phone] Subjective:    I'm seeing this patient by the request  of:  Unice Cobble, MD   CC: right knee pain follow up  [id number] Abigail Long is a 74 y.o. female coming in with complaint of  Right knee pain. Patient was found to have moderate osteophytic changes as well as a degenerative meniscal tear. Patient elected to try conservative therapy. Patient was to do home exercises, icing, topical anti-inflammatories. Patient states overall she is doing very well. Patient states that she is 60% better. States that she can walk almost normal at this time. Denies any new symptoms. Denies any locking or giving out on her. Has been doing the exercises regularly and did get the vitamins. Patient is very happy with the results.  Past Medical History  Diagnosis Date  . Hypertension    Past Surgical History  Procedure Laterality Date  . Tonsillectomy    . Dilation and curettage of uterus    . Colonoscopy  2008     Negative,Dr Henrene Pastor  . Tooth extraction      right side of mouth and implant placed    Social History  Substance Use Topics  . Smoking status: Never Smoker   . Smokeless tobacco: Never Used  . Alcohol Use: No   No Known Allergies Family History  Problem Relation Age of Onset  . Heart disease Father 37    CBAG X 3; pacer  . Hypertension Mother   . Cancer Maternal Grandfather     renal  . Cancer Paternal Grandfather     intra- abdominal  . Diabetes Neg Hx   . Hyperlipidemia Neg Hx   . Stroke Maternal Aunt 85  . Bipolar disorder Son         Past medical history, social, surgical and family history all reviewed in electronic medical record.   Review of Systems: No headache, visual changes, nausea, vomiting, diarrhea, constipation, dizziness, abdominal pain, skin rash, fevers, chills, night sweats, weight loss, swollen lymph nodes, body aches, joint swelling, muscle  aches, chest pain, shortness of breath, mood changes.   Objective Blood pressure 124/72, pulse 66, weight 178 lb (80.74 kg), SpO2 97 %.  General: No apparent distress alert and oriented x3 mood and affect normal, dressed appropriately.  HEENT: Pupils equal, extraocular movements intact  Respiratory: Patient's speak in full sentences and does not appear short of breath  Cardiovascular: No lower extremity edema, non tender, no erythema  Skin: Warm dry intact with no signs of infection or rash on extremities or on axial skeleton.  Abdomen: Soft nontender  Neuro: Cranial nerves II through XII are intact, neurovascularly intact in all extremities with 2+ DTRs and 2+ pulses.  Lymph: No lymphadenopathy of posterior or anterior cervical chain or axillae bilaterally.  Gait normal with good balance and coordination.  MSK:  Non tender with full range of motion and good stability and symmetric strength and tone of shoulders, elbows, wrist, hip, and ankles bilaterally. Mild to moderate osteophytic changes of multiple joints. Knee:right Mild varus deformity of the knee noted Tender to palpation over the patellofemoral joint as well as the medial joint space but minorly less than previous exam ROM full in flexion and extension and lower leg rotation. Ligaments with solid consistent endpoints including ACL, PCL, LCL, MCL. Negative, Apley's, and Thessalonian tests. Non painful patellar compression. Patellar glide without crepitus. Patellar and quadriceps tendons unremarkable. Hamstring and quadriceps strength  is normal.  Contralateral knee also has some varus deformity but no pain.      Impression and Recommendations:     This case required medical decision making of moderate complexity.

## 2015-11-06 NOTE — Assessment & Plan Note (Signed)
Patient is doing very well at this time. We discussed with patient to continue to do the exercises on a regular basis. Patient continue vitamins. See me again in 6 weeks for further evaluation. Patient has any worsening symptoms we can consider injection or formal physical therapy.

## 2015-11-06 NOTE — Progress Notes (Signed)
Pre visit review using our clinic review tool, if applicable. No additional management support is needed unless otherwise documented below in the visit note. 

## 2015-11-25 ENCOUNTER — Ambulatory Visit (INDEPENDENT_AMBULATORY_CARE_PROVIDER_SITE_OTHER): Payer: Medicare Other

## 2015-11-25 DIAGNOSIS — Z23 Encounter for immunization: Secondary | ICD-10-CM

## 2015-12-18 ENCOUNTER — Encounter: Payer: Self-pay | Admitting: Family Medicine

## 2015-12-18 ENCOUNTER — Ambulatory Visit (INDEPENDENT_AMBULATORY_CARE_PROVIDER_SITE_OTHER): Payer: Medicare Other | Admitting: Family Medicine

## 2015-12-18 VITALS — BP 138/78 | HR 69 | Ht 63.5 in | Wt 182.0 lb

## 2015-12-18 DIAGNOSIS — IMO0002 Reserved for concepts with insufficient information to code with codable children: Secondary | ICD-10-CM

## 2015-12-18 DIAGNOSIS — M129 Arthropathy, unspecified: Secondary | ICD-10-CM

## 2015-12-18 DIAGNOSIS — S83209A Unspecified tear of unspecified meniscus, current injury, unspecified knee, initial encounter: Secondary | ICD-10-CM | POA: Insufficient documentation

## 2015-12-18 DIAGNOSIS — M8580 Other specified disorders of bone density and structure, unspecified site: Secondary | ICD-10-CM | POA: Diagnosis not present

## 2015-12-18 DIAGNOSIS — S83206D Unspecified tear of unspecified meniscus, current injury, right knee, subsequent encounter: Secondary | ICD-10-CM | POA: Diagnosis not present

## 2015-12-18 NOTE — Progress Notes (Signed)
[facility] [place],  [address] Phone: [phone] Subjective:    I'm seeing this patient by the request  of:  Binnie Rail, MD   CC: right knee pain follow up  [id number] FRANCOISE Long is a 74 y.o. female coming in with complaint of  Right knee pain. Patient was found to have moderate osteophytic changes as well as a degenerative meniscal tear. Patient elected to try conservative therapy. Patient was to do home exercises, icing, topical anti-inflammatories. Patient was doing 60% better at last follow-up 6 weeks ago. Patient states she is now 90% better. Has some swelling from time to time. Still hurts her with a twisting motion but very minimal compared to what it was previously. He is going to the gym now 2-3 times a week and trying to strengthen the muscles. Very happy with the results. No nighttime waking, no giving out on her.  Past Medical History  Diagnosis Date  . Hypertension    Past Surgical History  Procedure Laterality Date  . Tonsillectomy    . Dilation and curettage of uterus    . Colonoscopy  2008     Negative,Dr Henrene Pastor  . Tooth extraction      right side of mouth and implant placed    Social History  Substance Use Topics  . Smoking status: Never Smoker   . Smokeless tobacco: Never Used  . Alcohol Use: No   No Known Allergies Family History  Problem Relation Age of Onset  . Heart disease Father 61    CBAG X 3; pacer  . Hypertension Mother   . Cancer Maternal Grandfather     renal  . Cancer Paternal Grandfather     intra- abdominal  . Diabetes Neg Hx   . Hyperlipidemia Neg Hx   . Stroke Maternal Aunt 85  . Bipolar disorder Son         Past medical history, social, surgical and family history all reviewed in electronic medical record.   Review of Systems: No headache, visual changes, nausea, vomiting, diarrhea, constipation, dizziness, abdominal pain, skin rash, fevers, chills, night sweats,  weight loss, swollen lymph nodes, body aches, joint swelling, muscle aches, chest pain, shortness of breath, mood changes.   Objective Blood pressure 138/78, pulse 69, height 5' 3.5" (1.613 m), weight 182 lb (82.555 kg), SpO2 96 %.  General: No apparent distress alert and oriented x3 mood and affect normal, dressed appropriately.  HEENT: Pupils equal, extraocular movements intact  Respiratory: Patient's speak in full sentences and does not appear short of breath  Cardiovascular: No lower extremity edema, non tender, no erythema  Skin: Warm dry intact with no signs of infection or rash on extremities or on axial skeleton.  Abdomen: Soft nontender  Neuro: Cranial nerves II through XII are intact, neurovascularly intact in all extremities with 2+ DTRs and 2+ pulses.  Lymph: No lymphadenopathy of posterior or anterior cervical chain or axillae bilaterally.  Gait normal with good balance and coordination.  MSK:  Non tender with full range of motion and good stability and symmetric strength and tone of shoulders, elbows, wrist, hip, and ankles bilaterally. Mild to moderate osteophytic changes of multiple joints. Knee:right Mild varus deformity of the knee noted patient also has a mild effusion noted Tender to palpation over the patellofemoral joint as well as the medial joint space but continued improvement ROM full in flexion and extension and lower leg rotation. Ligaments with solid consistent endpoints including  ACL, PCL, LCL, MCL. Negative, Apley's, and Thessalonian tests. Non painful patellar compression. Patellar glide without crepitus. Patellar and quadriceps tendons unremarkable. Hamstring and quadriceps strength is normal.  Contralateral knee also has some varus deformity but no pain.   Impression and Recommendations:     This case required medical decision making of moderate complexity.

## 2015-12-18 NOTE — Assessment & Plan Note (Signed)
Encourage patient to continue with the vitamin D supplementation.

## 2015-12-18 NOTE — Assessment & Plan Note (Signed)
Patient did have a meniscal injury as well as the underlying arthritis. Patient has an effusion today but states that she is feeling good and does have full range of motion. She has elected to continue with the conservative therapy. Patient will continue to remain active and encourage her to go to the gym at least 3 times a week. Patient is wearing good shoes, taking vitamins, as well as icing occasionally. We did discuss the possibility of a compression sleeve to help with swelling. Discussed that if this swelling around worsen come back and we will do an aspiration. Otherwise patient will follow-up as needed.

## 2015-12-18 NOTE — Progress Notes (Signed)
Pre visit review using our clinic review tool, if applicable. No additional management support is needed unless otherwise documented below in the visit note. 

## 2015-12-18 NOTE — Patient Instructions (Signed)
Happy New Year! Stay active at this time. You are doing great overall Try a compression sleeve (can get at [facility], [facility], [facility], [employer])  This can help with some of the swelling Cotinue the vitamins and the tylenol If any time worsening pain or swelling do not hesitate to call and we can drain the knee Otherwise see me when you need me .

## 2016-08-24 NOTE — Progress Notes (Signed)
Subjective:    Patient ID: Abigail Long, female    DOB: 01-11-1941, 75 y.o.   MRN: [id number]  HPI She is here to establish with a new pcp.   Here for medicare wellness exam and an annual physical exam.  Hypertension: She is taking her medication daily. She is not always compliant with a low sodium diet.  She denies chest pain, palpitations, edema, shortness of breath and regular headaches. She is exercising regularly.  She has gained weight.  She does monitor her blood pressure at home.      I have personally reviewed and have noted 1.The patient's medical and social history 2.Their use of alcohol, tobacco or illicit drugs 3.Their current medications and supplements 4.The patient's functional ability including ADL's, fall risks, home safety risks and hearing or visual impairment. 5.Diet and physical activities 6.Evidence for depression or mood disorders 7.Care team reviewed - gyn -  [place]    Are there smokers in your home (other than you)? No  Risk Factors Exercise: Y three times a week Dietary issues discussed:  - eats too many salt, sugars, does not enough vegetables, eats too portions  Cardiac risk factors: advanced age, hypertension, and obesity (BMI >= 30 kg/m2).  Depression Screen  Have you felt down, depressed or hopeless? No  Have you felt little interest or pleasure in doing things?  No  Activities of Daily Living In your present state of health, do you have any difficulty performing the following activities?:  Driving? No Managing money?  No Feeding yourself? No Getting from bed to chair? No Climbing a flight of stairs? No Preparing food and eating?: No Bathing or showering? No Getting dressed: No Getting to/using the toilet? No Moving around from place to place: No In the past year have you fallen or had a near fall?: No   Are you sexually active?  yes  Do you have more than one  partner?  No   Hearing Difficulties: No Do you often ask people to speak up or repeat themselves? No Do you experience ringing or noises in your ears? No Do you have difficulty understanding soft or whispered voices? Yes  Vision:              Any change in vision: No             Up to date with eye exam: yes  Memory:  Do you feel that you have a problem with memory? No - just some recall difficulty at times  Do you often misplace items? No  Do you feel safe at home?  Yes  Cognitive Testing  Alert, Orientated? Yes  Normal Appearance? Yes  Recall of three objects?  Yes  Can perform simple calculations? Yes  Displays appropriate judgment? Yes  Can read the correct time from a watch face? Yes   Advanced Directives have been discussed with the patient? Yes   Medications and allergies reviewed with patient and updated if appropriate.  Patient Active Problem List   Diagnosis Date Noted  . Meniscus tear 12/18/2015  . Arthritis of right lower extremity 10/08/2015  . Depression 08/21/2015  . Elevated TSH 08/16/2015  . HTN (hypertension) 05/23/2013  . Osteopenia 07/09/2010  . Hyperlipidemia 06/19/2009  . NONSPECIFIC ABNORMAL ELECTROCARDIOGRAM 06/19/2009    Current Outpatient Prescriptions on File Prior to Visit  Medication Sig Dispense Refill  . amLODipine (NORVASC) 5 MG tablet Take 1 tablet (5 mg total) by mouth daily. 90 tablet 3  . Ascorbic  Acid (VITAMIN C) 1000 MG tablet Take 1,000 mg by mouth daily.    Marland Kitchen aspirin 81 MG tablet Take 81 mg by mouth daily.      . Calcium Carbonate-Vit D-Min (CALCIUM 1200 PO) Take by mouth daily.    . Glucosamine-Chondroit-Vit C-Mn (GLUCOSAMINE 1500 COMPLEX PO) Take by mouth daily. Take 2-3 pills daily    . Multiple Vitamins-Minerals (CENTRUM SILVER PO) Take by mouth daily.       No current facility-administered medications on file prior to visit.     Past Medical History:  Diagnosis Date  . Hypertension     Past Surgical History:    Procedure Laterality Date  . COLONOSCOPY  2008    Negative,Dr Henrene Pastor  . DILATION AND CURETTAGE OF UTERUS    . TONSILLECTOMY    . TOOTH EXTRACTION     right side of mouth and implant placed     Social History   Social History  . Marital status: Married    Spouse name: N/A  . Number of children: N/A  . Years of education: N/A   Social History Main Topics  . Smoking status: Never Smoker  . Smokeless tobacco: Never Used  . Alcohol use No  . Drug use: No  . Sexual activity: Not on file   Other Topics Concern  . Not on file   Social History Narrative  . No narrative on file    Family History  Problem Relation Age of Onset  . Heart disease Father 33    CBAG X 3; pacer  . Hypertension Mother   . Cancer Maternal Grandfather     renal  . Cancer Paternal Grandfather     intra- abdominal  . Diabetes Neg Hx   . Hyperlipidemia Neg Hx   . Stroke Maternal Aunt 85  . Bipolar disorder Son     Review of Systems  Constitutional: Positive for fatigue (related to weight gain). Negative for appetite change, chills and fever.  HENT: Positive for postnasal drip. Negative for hearing loss and tinnitus.   Eyes: Negative for visual disturbance.  Respiratory: Positive for cough (related to PND). Negative for shortness of breath and wheezing.   Cardiovascular: Positive for leg swelling (mild). Negative for chest pain and palpitations.  Gastrointestinal: Negative for abdominal pain, blood in stool, constipation, diarrhea and nausea.       Rare gerd  Genitourinary: Negative for dysuria and hematuria.  Musculoskeletal: Positive for arthralgias (knees). Negative for back pain.  Skin: Negative for rash.       Itchy mole on back  Neurological: Negative for dizziness, light-headedness, numbness and headaches.  Psychiatric/Behavioral: Positive for dysphoric mood (controlled without medication). The patient is not nervous/anxious.        Objective:   Vitals:   08/25/16 1021  BP: (!)  142/84  Pulse: 62  Resp: 16  Temp: 97.7 F (36.5 C)   Filed Weights   08/25/16 1021  Weight: 201 lb (91.2 kg)   Body mass index is 35.05 kg/m.   Physical Exam Constitutional: She appears well-developed and well-nourished. No distress.  HENT:  Head: Normocephalic and atraumatic.  Right Ear: External ear normal. Normal ear canal and TM Left Ear: External ear normal.  Normal ear canal and TM Mouth/Throat: Oropharynx is clear and moist.  Eyes: Conjunctivae and EOM are normal.  Neck: Neck supple. No tracheal deviation present. No thyromegaly present.  No carotid bruit  Cardiovascular: Normal rate, regular rhythm and normal heart sounds.   No murmur heard.  No edema. Pulmonary/Chest: Effort normal and breath sounds normal. No respiratory distress. She has no wheezes. She has no rales.  Breast: deferred to Gyn - will schedule Abdominal: Soft. She exhibits no distension. There is no tenderness.  Lymphadenopathy: She has no cervical adenopathy.  Skin: Skin is warm and dry. She is not diaphoretic.  normal appearing moles Psychiatric: She has a normal mood and affect. Her behavior is normal.         Assessment & Plan:   Wellness Exam: Immunizations  prevnar due - deferred today, flu due - high dose today Colonoscopy  Up to date  Mammogram    Will schedule Dexa  Up to date  Gyn  Up to date  Eye exam  Up to date  Hearing loss  none Memory concerns/difficulties  None, except mild recall  Independent of ADLs  fully Stressed the importance of regular exercise and weight loss   Patient received copy of preventative screening tests/immunizations recommended for the next 5-10 years.   Physical exam: Screening blood work  ordered Immunizations  prevnar due - deferred today, flu due - high dose today Colonoscopy  Up to date  Mammogram  Will schedule Gyn  Up to date  Dexa  Up to date  Eye exams  Up to date  Exercise  - [facility] - stressed weight  loss - discussed importance of decreasing portions Skin  - skin exam normal - no concerning moles Substance abuse - none  See Problem List for Assessment and Plan of chronic medical problems.

## 2016-08-25 ENCOUNTER — Other Ambulatory Visit (INDEPENDENT_AMBULATORY_CARE_PROVIDER_SITE_OTHER): Payer: Medicare Other

## 2016-08-25 ENCOUNTER — Encounter: Payer: Self-pay | Admitting: Internal Medicine

## 2016-08-25 ENCOUNTER — Ambulatory Visit (INDEPENDENT_AMBULATORY_CARE_PROVIDER_SITE_OTHER): Payer: Medicare Other | Admitting: Internal Medicine

## 2016-08-25 VITALS — BP 142/84 | HR 62 | Temp 97.7°F | Resp 16 | Wt 201.0 lb

## 2016-08-25 DIAGNOSIS — I10 Essential (primary) hypertension: Secondary | ICD-10-CM | POA: Diagnosis not present

## 2016-08-25 DIAGNOSIS — Z0000 Encounter for general adult medical examination without abnormal findings: Secondary | ICD-10-CM

## 2016-08-25 DIAGNOSIS — E669 Obesity, unspecified: Secondary | ICD-10-CM | POA: Diagnosis not present

## 2016-08-25 DIAGNOSIS — F32A Depression, unspecified: Secondary | ICD-10-CM

## 2016-08-25 DIAGNOSIS — F329 Major depressive disorder, single episode, unspecified: Secondary | ICD-10-CM

## 2016-08-25 LAB — COMPREHENSIVE METABOLIC PANEL
ALK PHOS: 52 U/L (ref 39–117)
ALT: 25 U/L (ref 0–35)
AST: 23 U/L (ref 0–37)
Albumin: 4.3 g/dL (ref 3.5–5.2)
BUN: 13 mg/dL (ref 6–23)
CO2: 31 mEq/L (ref 19–32)
CREATININE: 0.7 mg/dL (ref 0.40–1.20)
Calcium: 9.8 mg/dL (ref 8.4–10.5)
Chloride: 100 mEq/L (ref 96–112)
GFR: 87.07 mL/min (ref >60.00)
GLUCOSE: 94 mg/dL (ref 70–99)
POTASSIUM: 4.2 meq/L (ref 3.5–5.1)
Sodium: 135 mEq/L (ref 135–145)
Total Bilirubin: 0.5 mg/dL (ref 0.2–1.2)
Total Protein: 7.2 g/dL (ref 6.0–8.3)

## 2016-08-25 LAB — CBC WITH DIFFERENTIAL/PLATELET
BASOS ABS: 0 10*3/uL (ref 0.0–0.1)
Basophils Relative: 0.5 % (ref 0.0–3.0)
EOS ABS: 0.3 10*3/uL (ref 0.0–0.7)
EOS PCT: 5.1 % — AB (ref 0.0–5.0)
HCT: 40.2 % (ref 36.0–46.0)
Hemoglobin: 13.6 g/dL (ref 12.0–15.0)
LYMPHS ABS: 1.6 10*3/uL (ref 0.7–4.0)
Lymphocytes Relative: 31.2 % (ref 12.0–46.0)
MCHC: 33.8 g/dL (ref 30.0–36.0)
MCV: 82.3 fl (ref 78.0–100.0)
MONO ABS: 0.7 10*3/uL (ref 0.1–1.0)
Monocytes Relative: 12.3 % — ABNORMAL HIGH (ref 3.0–12.0)
NEUTROS PCT: 50.9 % (ref 43.0–77.0)
Neutro Abs: 2.7 10*3/uL (ref 1.4–7.7)
Platelets: 280 10*3/uL (ref 150.0–400.0)
RBC: 4.88 Mil/uL (ref 3.87–5.11)
RDW: 13.6 % (ref 11.5–15.5)
WBC: 5.3 10*3/uL (ref 4.0–10.5)

## 2016-08-25 LAB — LIPID PANEL
Cholesterol: 182 mg/dL (ref 0–200)
HDL: 55.5 mg/dL (ref >39.00)
LDL CALC: 93 mg/dL (ref 0–99)
NONHDL: 126.65
Total CHOL/HDL Ratio: 3
Triglycerides: 170 mg/dL — ABNORMAL HIGH (ref 0.0–149.0)
VLDL: 34 mg/dL (ref 0.0–40.0)

## 2016-08-25 LAB — TSH: TSH: 2.38 u[IU]/mL (ref 0.35–4.50)

## 2016-08-25 NOTE — Assessment & Plan Note (Signed)
Controlled wihthout medication

## 2016-08-25 NOTE — Patient Instructions (Signed)
Abigail Long , Thank you for taking time to come for your Medicare Wellness Visit. I appreciate your ongoing commitment to your health goals. Please review the following plan we discussed and let me know if I can assist you in the future.   These are the goals we discussed: Goals    Work on weight loss      This is a list of the screening recommended for you and due dates:  Health Maintenance  Topic Date Due  . Pneumonia vaccines (2 of 2 - PCV13) 07/30/2011  . Mammogram  05/29/2016  . Flu Shot  Given today  . DEXA scan (bone density measurement)  10/16/2017  . Tetanus Vaccine  07/09/2020  . Colon Cancer Screening  05/19/2021  . Shingles Vaccine  Completed    Test(s) ordered today. Your results will be released to [place] (or called to you) after review, usually within 72hours after test completion. If any changes need to be made, you will be notified at that same time.  All other Health Maintenance issues reviewed.   All recommended immunizations and age-appropriate screenings are up-to-date or discussed.  Flu vaccine administered today.   Medications reviewed and updated.  No changes recommended at this time.   Please followup in one year for a physical.   Health Maintenance, Female Adopting a healthy lifestyle and getting preventive care can go a long way to promote health and wellness. Talk with your health care provider about what schedule of regular examinations is right for you. This is a good chance for you to check in with your provider about disease prevention and staying healthy. In between checkups, there are plenty of things you can do on your own. Experts have done a lot of research about which lifestyle changes and preventive measures are most likely to keep you healthy. Ask your health care provider for more information. WEIGHT AND DIET  Eat a healthy diet  Be sure to include plenty of vegetables, fruits, low-fat dairy products, and lean protein.  Do not eat a  lot of foods high in solid fats, added sugars, or salt.  Get regular exercise. This is one of the most important things you can do for your health.  Most adults should exercise for at least 150 minutes each week. The exercise should increase your heart rate and make you sweat (moderate-intensity exercise).  Most adults should also do strengthening exercises at least twice a week. This is in addition to the moderate-intensity exercise.  Maintain a healthy weight  Body mass index (BMI) is a measurement that can be used to identify possible weight problems. It estimates body fat based on height and weight. Your health care provider can help determine your BMI and help you achieve or maintain a healthy weight.  For females 45 years of age and older:   A BMI below 18.5 is considered underweight.  A BMI of 18.5 to 24.9 is normal.  A BMI of 25 to 29.9 is considered overweight.  A BMI of 30 and above is considered obese.  Watch levels of cholesterol and blood lipids  You should start having your blood tested for lipids and cholesterol at 73 years of age, then have this test every 5 years.  You may need to have your cholesterol levels checked more often if:  Your lipid or cholesterol levels are high.  You are older than 73 years of age.  You are at high risk for heart disease.  CANCER SCREENING   Lung Cancer  Lung cancer screening is recommended for adults 62-31 years old who are at high risk for lung cancer because of a history of smoking.  A yearly low-dose CT scan of the lungs is recommended for people who:  Currently smoke.  Have quit within the past 15 years.  Have at least a 30-pack-year history of smoking. A pack year is smoking an average of one pack of cigarettes a day for 1 year.  Yearly screening should continue until it has been 15 years since you quit.  Yearly screening should stop if you develop a health problem that would prevent you from having lung cancer  treatment.  Breast Cancer  Practice breast self-awareness. This means understanding how your breasts normally appear and feel.  It also means doing regular breast self-exams. Let your health care provider know about any changes, no matter how small.  If you are in your 20s or 30s, you should have a clinical breast exam (CBE) by a health care provider every 1-3 years as part of a regular health exam.  If you are 22 or older, have a CBE every year. Also consider having a breast X-ray (mammogram) every year.  If you have a family history of breast cancer, talk to your health care provider about genetic screening.  If you are at high risk for breast cancer, talk to your health care provider about having an MRI and a mammogram every year.  Breast cancer gene (BRCA) assessment is recommended for women who have family members with BRCA-related cancers. BRCA-related cancers include:  Breast.  Ovarian.  Tubal.  Peritoneal cancers.  Results of the assessment will determine the need for genetic counseling and BRCA1 and BRCA2 testing. Cervical Cancer Your health care provider may recommend that you be screened regularly for cancer of the pelvic organs (ovaries, uterus, and vagina). This screening involves a pelvic examination, including checking for microscopic changes to the surface of your cervix (Pap test). You may be encouraged to have this screening done every 3 years, beginning at age 59.  For women ages 41-65, health care providers may recommend pelvic exams and Pap testing every 3 years, or they may recommend the Pap and pelvic exam, combined with testing for human papilloma virus (HPV), every 5 years. Some types of HPV increase your risk of cervical cancer. Testing for HPV may also be done on women of any age with unclear Pap test results.  Other health care providers may not recommend any screening for nonpregnant women who are considered low risk for pelvic cancer and who do not have  symptoms. Ask your health care provider if a screening pelvic exam is right for you.  If you have had past treatment for cervical cancer or a condition that could lead to cancer, you need Pap tests and screening for cancer for at least 20 years after your treatment. If Pap tests have been discontinued, your risk factors (such as having a new sexual partner) need to be reassessed to determine if screening should resume. Some women have medical problems that increase the chance of getting cervical cancer. In these cases, your health care provider may recommend more frequent screening and Pap tests. Colorectal Cancer  This type of cancer can be detected and often prevented.  Routine colorectal cancer screening usually begins at 73 years of age and continues through 73 years of age.  Your health care provider may recommend screening at an earlier age if you have risk factors for colon cancer.  Your health care  provider may also recommend using home test kits to check for hidden blood in the stool.  A small camera at the end of a tube can be used to examine your colon directly (sigmoidoscopy or colonoscopy). This is done to check for the earliest forms of colorectal cancer.  Routine screening usually begins at age 79.  Direct examination of the colon should be repeated every 5-10 years through 73 years of age. However, you may need to be screened more often if early forms of precancerous polyps or small growths are found. Skin Cancer  Check your skin from head to toe regularly.  Tell your health care provider about any new moles or changes in moles, especially if there is a change in a mole's shape or color.  Also tell your health care provider if you have a mole that is larger than the size of a pencil eraser.  Always use sunscreen. Apply sunscreen liberally and repeatedly throughout the day.  Protect yourself by wearing long sleeves, pants, a wide-brimmed hat, and sunglasses whenever you are  outside. HEART DISEASE, DIABETES, AND HIGH BLOOD PRESSURE   High blood pressure causes heart disease and increases the risk of stroke. High blood pressure is more likely to develop in:  People who have blood pressure in the high end of the normal range (130-139/85-89 mm Hg).  People who are overweight or obese.  People who are African American.  If you are 89-73 years of age, have your blood pressure checked every 3-5 years. If you are 72 years of age or older, have your blood pressure checked every year. You should have your blood pressure measured twice--once when you are at a hospital or clinic, and once when you are not at a hospital or clinic. Record the average of the two measurements. To check your blood pressure when you are not at a hospital or clinic, you can use:  An automated blood pressure machine at a pharmacy.  A home blood pressure monitor.  If you are between 23 years and 58 years old, ask your health care provider if you should take aspirin to prevent strokes.  Have regular diabetes screenings. This involves taking a blood sample to check your fasting blood sugar level.  If you are at a normal weight and have a low risk for diabetes, have this test once every three years after 73 years of age.  If you are overweight and have a high risk for diabetes, consider being tested at a younger age or more often. PREVENTING INFECTION  Hepatitis B  If you have a higher risk for hepatitis B, you should be screened for this virus. You are considered at high risk for hepatitis B if:  You were born in a country where hepatitis B is common. Ask your health care provider which countries are considered high risk.  Your parents were born in a high-risk country, and you have not been immunized against hepatitis B (hepatitis B vaccine).  You have HIV or AIDS.  You use needles to inject street drugs.  You live with someone who has hepatitis B.  You have had sex with someone who has  hepatitis B.  You get hemodialysis treatment.  You take certain medicines for conditions, including cancer, organ transplantation, and autoimmune conditions. Hepatitis C  Blood testing is recommended for:  Everyone born from [birth year] through 1963.  Anyone with known risk factors for hepatitis C. Sexually transmitted infections (STIs)  You should be screened for sexually transmitted infections (STIs)  including gonorrhea and chlamydia if:  You are sexually active and are younger than 73 years of age.  You are older than 73 years of age and your health care provider tells you that you are at risk for this type of infection.  Your sexual activity has changed since you were last screened and you are at an increased risk for chlamydia or gonorrhea. Ask your health care provider if you are at risk.  If you do not have HIV, but are at risk, it may be recommended that you take a prescription medicine daily to prevent HIV infection. This is called pre-exposure prophylaxis (PrEP). You are considered at risk if:  You are sexually active and do not regularly use condoms or know the HIV status of your partner(s).  You take drugs by injection.  You are sexually active with a partner who has HIV. Talk with your health care provider about whether you are at high risk of being infected with HIV. If you choose to begin PrEP, you should first be tested for HIV. You should then be tested every 3 months for as long as you are taking PrEP.  PREGNANCY   If you are premenopausal and you may become pregnant, ask your health care provider about preconception counseling.  If you may become pregnant, take 400 to 800 micrograms (mcg) of folic acid every day.  If you want to prevent pregnancy, talk to your health care provider about birth control (contraception). OSTEOPOROSIS AND MENOPAUSE   Osteoporosis is a disease in which the bones lose minerals and strength with aging. This can result in serious bone  fractures. Your risk for osteoporosis can be identified using a bone density scan.  If you are 21 years of age or older, or if you are at risk for osteoporosis and fractures, ask your health care provider if you should be screened.  Ask your health care provider whether you should take a calcium or vitamin D supplement to lower your risk for osteoporosis.  Menopause may have certain physical symptoms and risks.  Hormone replacement therapy may reduce some of these symptoms and risks. Talk to your health care provider about whether hormone replacement therapy is right for you.  HOME CARE INSTRUCTIONS   Schedule regular health, dental, and eye exams.  Stay current with your immunizations.   Do not use any tobacco products including cigarettes, chewing tobacco, or electronic cigarettes.  If you are pregnant, do not drink alcohol.  If you are breastfeeding, limit how much and how often you drink alcohol.  Limit alcohol intake to no more than 1 drink per day for nonpregnant women. One drink equals 12 ounces of beer, 5 ounces of wine, or 1 ounces of hard liquor.  Do not use street drugs.  Do not share needles.  Ask your health care provider for help if you need support or information about quitting drugs.  Tell your health care provider if you often feel depressed.  Tell your health care provider if you have ever been abused or do not feel safe at home.   This information is not intended to replace advice given to you by your health care provider. Make sure you discuss any questions you have with your health care provider.   Document Released: 06/21/2011 Document Revised: 12/27/2014 Document Reviewed: 11/07/2013 Elsevier Interactive Patient Education [employer].

## 2016-08-25 NOTE — Assessment & Plan Note (Signed)
Stressed weight loss - exercising regularly - three times a week, needs to work on decreased intake

## 2016-08-25 NOTE — Progress Notes (Signed)
Pre visit review using our clinic review tool, if applicable. No additional management support is needed unless otherwise documented below in the visit note. 

## 2016-08-25 NOTE — Assessment & Plan Note (Signed)
BP well controlled, at goal of less than 150/90 Current regimen effective and well tolerated Continue current medication at current dose

## 2016-10-11 ENCOUNTER — Other Ambulatory Visit: Payer: Self-pay | Admitting: Internal Medicine

## 2017-01-07 ENCOUNTER — Ambulatory Visit (INDEPENDENT_AMBULATORY_CARE_PROVIDER_SITE_OTHER): Payer: Medicare Other | Admitting: Internal Medicine

## 2017-01-07 ENCOUNTER — Encounter: Payer: Self-pay | Admitting: Internal Medicine

## 2017-01-07 ENCOUNTER — Telehealth: Payer: Self-pay | Admitting: *Deleted

## 2017-01-07 VITALS — BP 122/66 | HR 49 | Temp 98.5°F | Resp 16 | Wt 177.0 lb

## 2017-01-07 DIAGNOSIS — J209 Acute bronchitis, unspecified: Secondary | ICD-10-CM

## 2017-01-07 MED ORDER — CEFDINIR 300 MG PO CAPS: 300.0000 mg | ORAL_CAPSULE | Freq: Two times a day (BID) | ORAL | 0 refills | Status: DC

## 2017-01-07 NOTE — Patient Instructions (Signed)

## 2017-01-07 NOTE — Progress Notes (Signed)
Subjective:    Patient ID: Abigail Long, female    DOB: 05-23-1941, 76 y.o.   MRN: [id number]  HPI She is here for an acute visit for cold symptoms.   Her symptoms started about three weeks ago.  She got better, but then got worse again.    She is experiencing a cough that is productive at times and nasal congestion.  Her cough is not getting better.  She denies fever, SOB, wheeze, chest tightness and sinus pain.   She has tried taking delsym, alka seltzer cold/flu with improvement in symptoms.    Medications and allergies reviewed with patient and updated if appropriate.  Patient Active Problem List   Diagnosis Date Noted  . Obese 08/25/2016  . Meniscus tear 12/18/2015  . Arthritis of right lower extremity 10/08/2015  . Depression 08/21/2015  . Elevated TSH 08/16/2015  . HTN (hypertension) 05/23/2013  . Osteopenia 07/09/2010  . Hyperlipidemia 06/19/2009  . NONSPECIFIC ABNORMAL ELECTROCARDIOGRAM 06/19/2009    Current Outpatient Prescriptions on File Prior to Visit  Medication Sig Dispense Refill  . amLODipine (NORVASC) 5 MG tablet Take 1 tablet (5 mg total) by mouth daily. 90 tablet 3  . Ascorbic Acid (VITAMIN C) 1000 MG tablet Take 1,000 mg by mouth daily.    Marland Kitchen aspirin 81 MG tablet Take 81 mg by mouth daily.      . Calcium Carbonate-Vit D-Min (CALCIUM 1200 PO) Take by mouth daily.    . Glucosamine-Chondroit-Vit C-Mn (GLUCOSAMINE 1500 COMPLEX PO) Take by mouth daily. Take 2-3 pills daily    . Multiple Vitamins-Minerals (CENTRUM SILVER PO) Take by mouth daily.       No current facility-administered medications on file prior to visit.     Past Medical History:  Diagnosis Date  . Hypertension     Past Surgical History:  Procedure Laterality Date  . COLONOSCOPY  2008    Negative,Dr Henrene Pastor  . DILATION AND CURETTAGE OF UTERUS    . TONSILLECTOMY    . TOOTH EXTRACTION     right side of mouth and implant placed     Social History   Social History  . Marital  status: Married    Spouse name: N/A  . Number of children: N/A  . Years of education: N/A   Social History Main Topics  . Smoking status: Never Smoker  . Smokeless tobacco: Never Used  . Alcohol use No  . Drug use: No  . Sexual activity: Not on file   Other Topics Concern  . Not on file   Social History Narrative   Ex[facility]    Family History  Problem Relation Age of Onset  . Heart disease Father 22    CBAG X 3; pacer  . Hypertension Mother   . Cancer Maternal Grandfather     renal  . Cancer Paternal Grandfather     intra- abdominal  . Stroke Maternal Aunt 85  . Bipolar disorder Son   . Diabetes Neg Hx   . Hyperlipidemia Neg Hx     Review of Systems  Constitutional: Negative for chills and fever.  HENT: Positive for congestion. Negative for ear pain, sinus pain, sinus pressure and sore throat (just from coughing).   Respiratory: Positive for cough. Negative for chest tightness, shortness of breath and wheezing.   Gastrointestinal: Negative for abdominal pain, diarrhea and nausea.  Musculoskeletal: Negative for myalgias.  Neurological: Negative for dizziness, light-headedness and headaches.  Objective:   Vitals:   01/07/17 1257  BP: 122/66  Pulse: (!) 49  Resp: 16  Temp: 98.5 F (36.9 C)   Filed Weights   01/07/17 1257  Weight: 177 lb (80.3 kg)   Body mass index is 30.86 kg/m.  Wt Readings from Last 3 Encounters:  01/07/17 177 lb (80.3 kg)  08/25/16 201 lb (91.2 kg)  12/18/15 182 lb (82.6 kg)     Physical Exam GENERAL APPEARANCE: Appears stated age, well appearing, NAD EYES: conjunctiva clear, no icterus HEENT: bilateral tympanic membranes and ear canals normal, oropharynx with mild erythema, no thyromegaly, trachea midline, no cervical or supraclavicular lymphadenopathy LUNGS: Clear to auscultation without wheeze or crackles, unlabored breathing, good air entry bilaterally HEART: Normal S1,S2 without  murmurs EXTREMITIES: Without clubbing, cyanosis, or edema        Assessment & Plan:   See Problem List for Assessment and Plan of chronic medical problems.

## 2017-01-07 NOTE — Telephone Encounter (Signed)
Pt left msg on triage stating she has been dealing w/ this cold since christmas, Though it was gone, but have a lot of chest congestion requesting MD to rx something. Called pt back inform her before MD can rx something she will need OV. Made appt for this afternoon @ 1:00...Johny Chess

## 2017-01-07 NOTE — Progress Notes (Signed)
Pre visit review using our clinic review tool, if applicable. No additional management support is needed unless otherwise documented below in the visit note. 

## 2017-01-07 NOTE — Assessment & Plan Note (Signed)
Likely bacterial  Will start omnicef BID x 7 days otc cold meds for symptom relief Call if no improvement

## 2017-01-14 ENCOUNTER — Telehealth: Payer: Self-pay | Admitting: Internal Medicine

## 2017-01-14 NOTE — Telephone Encounter (Signed)
She just completed antibiotics.  Does she feel ay better?  If she feels a little better she should give it a couple of more days.  If she feels worse or the same she should try to be seen tomorrow at the clinic or next week.

## 2017-01-14 NOTE — Telephone Encounter (Signed)
PLEASE NOTE: All timestamps contained within this report are represented as [place] Standard Time. CONFIDENTIALTY NOTICE: This fax transmission is intended only for the addressee. It contains information that is legally privileged, confidential or otherwise protected from use or disclosure. If you are not the intended recipient, you are strictly prohibited from reviewing, disclosing, copying using or disseminating any of this information or taking any action in reliance on or regarding this information. If you have received this fax in error, please notify us immediately by telephone so that we can arrange for its return to [place]. Phone: [phone], Toll-Free: [phone], Fax: [phone] Page: 1 of 1 Call Id: [id number] [facility] Patient Name: Abigail Long DOB: 15-May-1941 Initial Comment caller states she has cough and congestion Nurse Assessment Nurse: Dimas Chyle, RN, Dellis Filbert Date/Time Eilene Ghazi Time): 01/14/2017 1:05:07 PM Confirm and document reason for call. If symptomatic, describe symptoms. ---Caller states she has cough and congestion. Symptoms since Christmas. No fever. Seen in office last week and completed antibiotic. Does the patient have any new or worsening symptoms? ---Yes Will a triage be completed? ---Yes Related visit to physician within the last 2 weeks? ---Yes Does the PT have any chronic conditions? (i.e. diabetes, asthma, etc.) ---Yes List chronic conditions. ---HTN Is this a behavioral health or substance abuse call? ---No Guidelines Guideline Title Affirmed Question Affirmed Notes Cough - Acute Productive Cough present > 10 days Final Disposition User See PCP When Office is Open (within 3 days) Dimas Chyle, RN, [employer] was wanting to be contacted by PCP. Disagree/Comply: Comply

## 2017-01-14 NOTE — Telephone Encounter (Signed)
Pt informed of PCP response.  Pt stated that she is feeling better and will give it a couple more days.

## 2017-01-14 NOTE — Telephone Encounter (Signed)
Noted  

## 2017-06-27 ENCOUNTER — Other Ambulatory Visit: Payer: Self-pay | Admitting: Internal Medicine

## 2017-06-27 DIAGNOSIS — Z1231 Encounter for screening mammogram for malignant neoplasm of breast: Secondary | ICD-10-CM

## 2017-07-12 ENCOUNTER — Ambulatory Visit
Admission: RE | Admit: 2017-07-12 | Discharge: 2017-07-12 | Disposition: A | Discharge status: Home / Self Care | Payer: Medicare Other | Source: Ambulatory Visit | Attending: Internal Medicine | Admitting: Internal Medicine

## 2017-07-12 DIAGNOSIS — Z1231 Encounter for screening mammogram for malignant neoplasm of breast: Secondary | ICD-10-CM

## 2017-08-27 NOTE — Patient Instructions (Addendum)
Abigail Long , Thank you for taking time to come for your Medicare Wellness Visit. I appreciate your ongoing commitment to your health goals. Please review the following plan we discussed and let me know if I can assist you in the future.   These are the goals we discussed: Goals    Restart regular exercise, work on weight loss      This is a list of the screening recommended for you and due dates:  Health Maintenance  Topic Date Due  . Pneumonia vaccines (2 of 2 - PCV13) today  . Flu Shot  07/20/2017  . DEXA scan (bone density measurement)  10/16/2017  . Mammogram  07/13/2019  . Tetanus Vaccine  07/09/2020  . Colon Cancer Screening  05/19/2021     Test(s) ordered today. Your results will be released to [place] (or called to you) after review, usually within 72hours after test completion. If any changes need to be made, you will be notified at that same time.  All other Health Maintenance issues reviewed.   All recommended immunizations and age-appropriate screenings are up-to-date or discussed.  No immunizations administered today.   Medications reviewed and updated.  No changes recommended at this time.  Your prescription(s) have been submitted to your pharmacy. Please take as directed and contact our office if you believe you are having problem(s) with the medication(s).   Please followup in one year   Health Maintenance, Female Adopting a healthy lifestyle and getting preventive care can go a long way to promote health and wellness. Talk with your health care provider about what schedule of regular examinations is right for you. This is a good chance for you to check in with your provider about disease prevention and staying healthy. In between checkups, there are plenty of things you can do on your own. Experts have done a lot of research about which lifestyle changes and preventive measures are most likely to keep you healthy. Ask your health care provider for more  information. Weight and diet Eat a healthy diet  Be sure to include plenty of vegetables, fruits, low-fat dairy products, and lean protein.  Do not eat a lot of foods high in solid fats, added sugars, or salt.  Get regular exercise. This is one of the most important things you can do for your health. ? Most adults should exercise for at least 150 minutes each week. The exercise should increase your heart rate and make you sweat (moderate-intensity exercise). ? Most adults should also do strengthening exercises at least twice a week. This is in addition to the moderate-intensity exercise.  Maintain a healthy weight  Body mass index (BMI) is a measurement that can be used to identify possible weight problems. It estimates body fat based on height and weight. Your health care provider can help determine your BMI and help you achieve or maintain a healthy weight.  For females 49 years of age and older: ? A BMI below 18.5 is considered underweight. ? A BMI of 18.5 to 24.9 is normal. ? A BMI of 25 to 29.9 is considered overweight. ? A BMI of 30 and above is considered obese.  Watch levels of cholesterol and blood lipids  You should start having your blood tested for lipids and cholesterol at 74 years of age, then have this test every 5 years.  You may need to have your cholesterol levels checked more often if: ? Your lipid or cholesterol levels are high. ? You are older than 74  years of age. ? You are at high risk for heart disease.  Cancer screening Lung Cancer  Lung cancer screening is recommended for adults 39-23 years old who are at high risk for lung cancer because of a history of smoking.  A yearly low-dose CT scan of the lungs is recommended for people who: ? Currently smoke. ? Have quit within the past 15 years. ? Have at least a 30-pack-year history of smoking. A pack year is smoking an average of one pack of cigarettes a day for 1 year.  Yearly screening should continue  until it has been 15 years since you quit.  Yearly screening should stop if you develop a health problem that would prevent you from having lung cancer treatment.  Breast Cancer  Practice breast self-awareness. This means understanding how your breasts normally appear and feel.  It also means doing regular breast self-exams. Let your health care provider know about any changes, no matter how small.  If you are in your 20s or 30s, you should have a clinical breast exam (CBE) by a health care provider every 1-3 years as part of a regular health exam.  If you are 25 or older, have a CBE every year. Also consider having a breast X-ray (mammogram) every year.  If you have a family history of breast cancer, talk to your health care provider about genetic screening.  If you are at high risk for breast cancer, talk to your health care provider about having an MRI and a mammogram every year.  Breast cancer gene (BRCA) assessment is recommended for women who have family members with BRCA-related cancers. BRCA-related cancers include: ? Breast. ? Ovarian. ? Tubal. ? Peritoneal cancers.  Results of the assessment will determine the need for genetic counseling and BRCA1 and BRCA2 testing.  Cervical Cancer Your health care provider may recommend that you be screened regularly for cancer of the pelvic organs (ovaries, uterus, and vagina). This screening involves a pelvic examination, including checking for microscopic changes to the surface of your cervix (Pap test). You may be encouraged to have this screening done every 3 years, beginning at age 59.  For women ages 56-65, health care providers may recommend pelvic exams and Pap testing every 3 years, or they may recommend the Pap and pelvic exam, combined with testing for human papilloma virus (HPV), every 5 years. Some types of HPV increase your risk of cervical cancer. Testing for HPV may also be done on women of any age with unclear Pap test  results.  Other health care providers may not recommend any screening for nonpregnant women who are considered low risk for pelvic cancer and who do not have symptoms. Ask your health care provider if a screening pelvic exam is right for you.  If you have had past treatment for cervical cancer or a condition that could lead to cancer, you need Pap tests and screening for cancer for at least 20 years after your treatment. If Pap tests have been discontinued, your risk factors (such as having a new sexual partner) need to be reassessed to determine if screening should resume. Some women have medical problems that increase the chance of getting cervical cancer. In these cases, your health care provider may recommend more frequent screening and Pap tests.  Colorectal Cancer  This type of cancer can be detected and often prevented.  Routine colorectal cancer screening usually begins at 74 years of age and continues through 74 years of age.  Your health care  provider may recommend screening at an earlier age if you have risk factors for colon cancer.  Your health care provider may also recommend using home test kits to check for hidden blood in the stool.  A small camera at the end of a tube can be used to examine your colon directly (sigmoidoscopy or colonoscopy). This is done to check for the earliest forms of colorectal cancer.  Routine screening usually begins at age 75.  Direct examination of the colon should be repeated every 5-10 years through 74 years of age. However, you may need to be screened more often if early forms of precancerous polyps or small growths are found.  Skin Cancer  Check your skin from head to toe regularly.  Tell your health care provider about any new moles or changes in moles, especially if there is a change in a mole's shape or color.  Also tell your health care provider if you have a mole that is larger than the size of a pencil eraser.  Always use sunscreen.  Apply sunscreen liberally and repeatedly throughout the day.  Protect yourself by wearing long sleeves, pants, a wide-brimmed hat, and sunglasses whenever you are outside.  Heart disease, diabetes, and high blood pressure  High blood pressure causes heart disease and increases the risk of stroke. High blood pressure is more likely to develop in: ? People who have blood pressure in the high end of the normal range (130-139/85-89 mm Hg). ? People who are overweight or obese. ? People who are African American.  If you are 47-77 years of age, have your blood pressure checked every 3-5 years. If you are 35 years of age or older, have your blood pressure checked every year. You should have your blood pressure measured twice-once when you are at a hospital or clinic, and once when you are not at a hospital or clinic. Record the average of the two measurements. To check your blood pressure when you are not at a hospital or clinic, you can use: ? An automated blood pressure machine at a pharmacy. ? A home blood pressure monitor.  If you are between 66 years and 35 years old, ask your health care provider if you should take aspirin to prevent strokes.  Have regular diabetes screenings. This involves taking a blood sample to check your fasting blood sugar level. ? If you are at a normal weight and have a low risk for diabetes, have this test once every three years after 74 years of age. ? If you are overweight and have a high risk for diabetes, consider being tested at a younger age or more often. Preventing infection Hepatitis B  If you have a higher risk for hepatitis B, you should be screened for this virus. You are considered at high risk for hepatitis B if: ? You were born in a country where hepatitis B is common. Ask your health care provider which countries are considered high risk. ? Your parents were born in a high-risk country, and you have not been immunized against hepatitis B (hepatitis B  vaccine). ? You have HIV or AIDS. ? You use needles to inject street drugs. ? You live with someone who has hepatitis B. ? You have had sex with someone who has hepatitis B. ? You get hemodialysis treatment. ? You take certain medicines for conditions, including cancer, organ transplantation, and autoimmune conditions.  Hepatitis C  Blood testing is recommended for: ? Everyone born from [birth year] through 1963. ? Anyone  with known risk factors for hepatitis C.  Sexually transmitted infections (STIs)  You should be screened for sexually transmitted infections (STIs) including gonorrhea and chlamydia if: ? You are sexually active and are younger than 74 years of age. ? You are older than 74 years of age and your health care provider tells you that you are at risk for this type of infection. ? Your sexual activity has changed since you were last screened and you are at an increased risk for chlamydia or gonorrhea. Ask your health care provider if you are at risk.  If you do not have HIV, but are at risk, it may be recommended that you take a prescription medicine daily to prevent HIV infection. This is called pre-exposure prophylaxis (PrEP). You are considered at risk if: ? You are sexually active and do not regularly use condoms or know the HIV status of your partner(s). ? You take drugs by injection. ? You are sexually active with a partner who has HIV.  Talk with your health care provider about whether you are at high risk of being infected with HIV. If you choose to begin PrEP, you should first be tested for HIV. You should then be tested every 3 months for as long as you are taking PrEP. Pregnancy  If you are premenopausal and you may become pregnant, ask your health care provider about preconception counseling.  If you may become pregnant, take 400 to 800 micrograms (mcg) of folic acid every day.  If you want to prevent pregnancy, talk to your health care provider about birth control  (contraception). Osteoporosis and menopause  Osteoporosis is a disease in which the bones lose minerals and strength with aging. This can result in serious bone fractures. Your risk for osteoporosis can be identified using a bone density scan.  If you are 70 years of age or older, or if you are at risk for osteoporosis and fractures, ask your health care provider if you should be screened.  Ask your health care provider whether you should take a calcium or vitamin D supplement to lower your risk for osteoporosis.  Menopause may have certain physical symptoms and risks.  Hormone replacement therapy may reduce some of these symptoms and risks. Talk to your health care provider about whether hormone replacement therapy is right for you. Follow these instructions at home:  Schedule regular health, dental, and eye exams.  Stay current with your immunizations.  Do not use any tobacco products including cigarettes, chewing tobacco, or electronic cigarettes.  If you are pregnant, do not drink alcohol.  If you are breastfeeding, limit how much and how often you drink alcohol.  Limit alcohol intake to no more than 1 drink per day for nonpregnant women. One drink equals 12 ounces of beer, 5 ounces of wine, or 1 ounces of hard liquor.  Do not use street drugs.  Do not share needles.  Ask your health care provider for help if you need support or information about quitting drugs.  Tell your health care provider if you often feel depressed.  Tell your health care provider if you have ever been abused or do not feel safe at home. This information is not intended to replace advice given to you by your health care provider. Make sure you discuss any questions you have with your health care provider. Document Released: 06/21/2011 Document Revised: 05/13/2016 Document Reviewed: 09/09/2015 Elsevier Interactive Patient Education  [employer].

## 2017-08-27 NOTE — Progress Notes (Signed)
Subjective:    Patient ID: Abigail Long, female    DOB: [birth year], 76 y.o.   MRN: [id number]  HPI Here for medicare wellness exam and annual physical exam.   I have personally reviewed and have noted 1.The patient's medical and social history 2.Their use of alcohol, tobacco or illicit drugs 3.Their current medications and supplements 4.The patient's functional ability including ADL's, fall risks, home                 safety risk and hearing or visual impairment. 5.Diet and physical activities 6.Evidence for depression or mood disorders 7.Care team reviewed  - [place], [place] doctor   Are there smokers in your home (other than you)? No  Risk Factors Exercise: not currently [facility] Dietary issues discussed: over the summer eating too much and eating the wrong foods, will get back to weight watchers.  Cardiac risk factors: advanced age, hypertension, hyperlipidemia, and obesity (BMI >= 30 kg/m2).  Depression Screen  Have you felt down, depressed or hopeless? No  Have you felt little interest or pleasure in doing things?  No  Activities of Daily Living In your present state of health, do you have any difficulty performing the following activities?:  Driving? No Managing money?  No Feeding yourself? No Getting from bed to chair? No Climbing a flight of stairs? No Preparing food and eating?: No Bathing or showering? No Getting dressed: No Getting to/using the toilet? No Moving around from place to place: No In the past year have you fallen or had a near fall?: No   Are you sexually active?  yes  Do you have more than one partner?  No  Hearing Difficulties: No Do you often ask people to speak up or repeat themselves? No Do you experience ringing or noises in your ears? No Do you have difficulty understanding soft or whispered voices? No Vision:              Any  change in vision:  no             Up to date with eye exam:  yes   Memory:  Do you feel that you have a problem with memory? No, except mild                 recall difficulties  Do you often misplace items? No  Do you feel safe at home?  Yes  Cognitive Testing  Alert, Orientated? Yes  Normal Appearance? Yes  Recall of three objects?  Yes  Can perform simple calculations? Yes  Displays appropriate judgment? Yes  Can read the correct time from a watch face? Yes   Advanced Directives have been discussed with the patient? Yes - in place   Medications and allergies reviewed with patient and updated if appropriate.  Patient Active Problem List   Diagnosis Date Noted  . Obese 08/25/2016  . Meniscus tear 12/18/2015  . Arthritis of right lower extremity 10/08/2015  . Depression 08/21/2015  . Elevated TSH 08/16/2015  . HTN (hypertension) 05/23/2013  . Osteopenia 07/09/2010  . Hyperlipidemia 06/19/2009  . NONSPECIFIC ABNORMAL ELECTROCARDIOGRAM 06/19/2009    Current Outpatient Prescriptions on File Prior to Visit  Medication Sig Dispense Refill  . amLODipine (NORVASC) 5 MG tablet Take 1 tablet (5 mg total) by mouth daily. 90 tablet 3  . Ascorbic Acid (VITAMIN C) 1000 MG tablet Take 1,000 mg by mouth daily.    Marland Kitchen  aspirin 81 MG tablet Take 81 mg by mouth daily.      . Calcium Carbonate-Vit D-Min (CALCIUM 1200 PO) Take by mouth daily.    . Glucosamine-Chondroit-Vit C-Mn (GLUCOSAMINE 1500 COMPLEX PO) Take by mouth daily. Take 2-3 pills daily    . Multiple Vitamins-Minerals (CENTRUM SILVER PO) Take by mouth daily.       No current facility-administered medications on file prior to visit.     Past Medical History:  Diagnosis Date  . Hypertension     Past Surgical History:  Procedure Laterality Date  . COLONOSCOPY  2008    Negative,Dr Henrene Pastor  . DILATION AND CURETTAGE OF UTERUS    . TONSILLECTOMY    . TOOTH EXTRACTION     right side of mouth and implant placed     Social  History   Social History  . Marital status: Married    Spouse name: N/A  . Number of children: N/A  . Years of education: N/A   Social History Main Topics  . Smoking status: Never Smoker  . Smokeless tobacco: Never Used  . Alcohol use No  . Drug use: No  . Sexual activity: Not on file   Other Topics Concern  . Not on file   Social History Narrative   Ex[facility]:  [facility]    Family History  Problem Relation Age of Onset  . Heart disease Father 27       CBAG X 3; pacer  . Hypertension Mother   . Cancer Maternal Grandfather        renal  . Cancer Paternal Grandfather        intra- abdominal  . Stroke Maternal Aunt 85  . Breast cancer Maternal Aunt   . Bipolar disorder Son   . Diabetes Neg Hx   . Hyperlipidemia Neg Hx     Review of Systems  Constitutional: Negative for chills and fever.  HENT: Negative for hearing loss and tinnitus.   Eyes: Negative for visual disturbance.  Respiratory: Negative for cough, shortness of breath and wheezing.   Cardiovascular: Negative for chest pain, palpitations and leg swelling.  Gastrointestinal: Negative for abdominal pain, blood in stool, constipation, diarrhea and nausea.       No gerd  Genitourinary: Negative for dysuria and hematuria.  Musculoskeletal: Negative for arthralgias and back pain.  Skin: Negative for color change and rash.  Neurological: Negative for light-headedness and headaches.  Psychiatric/Behavioral: Negative for dysphoric mood. The patient is not nervous/anxious.        Objective:   Vitals:   08/29/17 0859  BP: 138/80  Pulse: 67  Resp: 16  Temp: 98.1 F (36.7 C)  SpO2: 96%   Filed Weights   08/29/17 0859  Weight: 192 lb (87.1 kg)   Body mass index is 33.48 kg/m.  Wt Readings from Last 3 Encounters:  08/29/17 192 lb (87.1 kg)  01/07/17 177 lb (80.3 kg)  08/25/16 201 lb (91.2 kg)     Physical Exam Constitutional: She appears well-developed and well-nourished. No  distress.  HENT:  Head: Normocephalic and atraumatic.  Right Ear: External ear normal. Normal ear canal and TM Left Ear: External ear normal.  Normal ear canal and TM Mouth/Throat: Oropharynx is clear and moist.  Eyes: Conjunctivae and EOM are normal.  Neck: Neck supple. No tracheal deviation present. No thyromegaly present.  No carotid bruit  Cardiovascular: Normal rate, regular rhythm and normal heart sounds.   No murmur heard.  No edema. Pulmonary/Chest: Effort normal and breath sounds normal. No respiratory distress. She has no wheezes. She has no rales.  Breast: deferred to Gyn Abdominal: Soft. She exhibits no distension. There is no tenderness.  Lymphadenopathy: She has no cervical adenopathy.  Skin: Skin is warm and dry. She is not diaphoretic.  Psychiatric: She has a normal mood and affect. Her behavior is normal.         Assessment & Plan:   Wellness Exam: Immunizations  prevnar today, flu in month, discussed shingrix Colonoscopy   Up to date  Mammogram  Up to date  Gyn - last saw gyn about two years ago Dexa  Due this year Eye exams  Up to date  Hearing loss   none Memory concerns/difficulties    none Independent of ADLs   fully Stressed the importance of regular exercise   Patient received copy of preventative screening tests/immunizations recommended for the next 5-10 years.   Physical exam: Screening blood work ordered Immunizations  prevnar today, flu in month, discussed shingrix Colonoscopy   Up to date  Mammogram  Up to date  Gyn - last saw gyn about two years ago Dexa  Due next year Eye exams   Up to date  EKG   Last EKG 2013 Exercise  None now - will restart regular exercise Weight - advised weight loss Skin   No concerns Substance abuse   none  See Problem List for Assessment and Plan of chronic medical problems.  FU annually

## 2017-08-29 ENCOUNTER — Encounter: Payer: Self-pay | Admitting: Internal Medicine

## 2017-08-29 ENCOUNTER — Other Ambulatory Visit (INDEPENDENT_AMBULATORY_CARE_PROVIDER_SITE_OTHER): Payer: Medicare Other

## 2017-08-29 ENCOUNTER — Ambulatory Visit (INDEPENDENT_AMBULATORY_CARE_PROVIDER_SITE_OTHER): Payer: Medicare Other | Admitting: Internal Medicine

## 2017-08-29 VITALS — BP 138/80 | HR 67 | Temp 98.1°F | Resp 16 | Ht 63.5 in | Wt 192.0 lb

## 2017-08-29 DIAGNOSIS — Z0000 Encounter for general adult medical examination without abnormal findings: Secondary | ICD-10-CM

## 2017-08-29 DIAGNOSIS — E6609 Other obesity due to excess calories: Secondary | ICD-10-CM

## 2017-08-29 DIAGNOSIS — I10 Essential (primary) hypertension: Secondary | ICD-10-CM

## 2017-08-29 DIAGNOSIS — Z6833 Body mass index (BMI) 33.0-33.9, adult: Secondary | ICD-10-CM

## 2017-08-29 DIAGNOSIS — Z23 Encounter for immunization: Secondary | ICD-10-CM

## 2017-08-29 DIAGNOSIS — M85859 Other specified disorders of bone density and structure, unspecified thigh: Secondary | ICD-10-CM

## 2017-08-29 LAB — CBC WITH DIFFERENTIAL/PLATELET
BASOS PCT: 0.6 % (ref 0.0–3.0)
Basophils Absolute: 0 10*3/uL (ref 0.0–0.1)
EOS ABS: 0.2 10*3/uL (ref 0.0–0.7)
Eosinophils Relative: 3.4 % (ref 0.0–5.0)
HCT: 44.9 % (ref 36.0–46.0)
Hemoglobin: 14.8 g/dL (ref 12.0–15.0)
LYMPHS ABS: 1.8 10*3/uL (ref 0.7–4.0)
Lymphocytes Relative: 29.1 % (ref 12.0–46.0)
MCHC: 32.9 g/dL (ref 30.0–36.0)
MCV: 84.9 fl (ref 78.0–100.0)
MONO ABS: 0.7 10*3/uL (ref 0.1–1.0)
Monocytes Relative: 11.3 % (ref 3.0–12.0)
NEUTROS ABS: 3.5 10*3/uL (ref 1.4–7.7)
NEUTROS PCT: 55.6 % (ref 43.0–77.0)
PLATELETS: 311 10*3/uL (ref 150.0–400.0)
RBC: 5.29 Mil/uL — ABNORMAL HIGH (ref 3.87–5.11)
RDW: 13.6 % (ref 11.5–15.5)
WBC: 6.3 10*3/uL (ref 4.0–10.5)

## 2017-08-29 LAB — COMPREHENSIVE METABOLIC PANEL
ALT: 21 U/L (ref 0–35)
AST: 21 U/L (ref 0–37)
Albumin: 4.6 g/dL (ref 3.5–5.2)
Alkaline Phosphatase: 51 U/L (ref 39–117)
BILIRUBIN TOTAL: 0.4 mg/dL (ref 0.2–1.2)
BUN: 15 mg/dL (ref 6–23)
CHLORIDE: 100 meq/L (ref 96–112)
CO2: 32 mEq/L (ref 19–32)
CREATININE: 0.72 mg/dL (ref 0.40–1.20)
Calcium: 10.7 mg/dL — ABNORMAL HIGH (ref 8.4–10.5)
GFR: 84.05 mL/min (ref >60.00)
GLUCOSE: 83 mg/dL (ref 70–99)
Potassium: 4.4 mEq/L (ref 3.5–5.1)
SODIUM: 140 meq/L (ref 135–145)
Total Protein: 7.6 g/dL (ref 6.0–8.3)

## 2017-08-29 LAB — LIPID PANEL
CHOL/HDL RATIO: 3
CHOLESTEROL: 217 mg/dL — AB (ref 0–200)
HDL: 67.9 mg/dL (ref >39.00)
LDL CALC: 129 mg/dL — AB (ref 0–99)
NonHDL: 148.66
Triglycerides: 96 mg/dL (ref 0.0–149.0)
VLDL: 19.2 mg/dL (ref 0.0–40.0)

## 2017-08-29 LAB — TSH: TSH: 3.51 u[IU]/mL (ref 0.35–4.50)

## 2017-08-29 LAB — HEMOGLOBIN A1C: Hgb A1c MFr Bld: 5.8 % (ref 4.6–6.5)

## 2017-08-29 NOTE — Assessment & Plan Note (Addendum)
BP borderline high Current regimen effective and well tolerated Continue current medications at current doses cmp

## 2017-08-29 NOTE — Assessment & Plan Note (Signed)
Advised weight loss, regular exercise Goal last year 160

## 2017-08-29 NOTE — Assessment & Plan Note (Signed)
dexa due next year  Has been done by gyn in past - she may go back to the same practice and ideally should have it done there - if not next year we will do here.

## 2017-09-06 DIAGNOSIS — H5203 Hypermetropia, bilateral: Secondary | ICD-10-CM | POA: Diagnosis not present

## 2017-09-27 ENCOUNTER — Ambulatory Visit (INDEPENDENT_AMBULATORY_CARE_PROVIDER_SITE_OTHER): Payer: Medicare Other

## 2017-09-27 DIAGNOSIS — Z23 Encounter for immunization: Secondary | ICD-10-CM

## 2017-09-28 ENCOUNTER — Other Ambulatory Visit: Payer: Self-pay | Admitting: Internal Medicine

## 2017-10-05 ENCOUNTER — Encounter: Payer: Self-pay | Admitting: Obstetrics & Gynecology

## 2017-10-05 ENCOUNTER — Ambulatory Visit (INDEPENDENT_AMBULATORY_CARE_PROVIDER_SITE_OTHER): Payer: Medicare Other | Admitting: Obstetrics & Gynecology

## 2017-10-05 VITALS — BP 122/78 | Ht 63.0 in | Wt 193.0 lb

## 2017-10-05 DIAGNOSIS — Z780 Asymptomatic menopausal state: Secondary | ICD-10-CM | POA: Diagnosis not present

## 2017-10-05 DIAGNOSIS — M85851 Other specified disorders of bone density and structure, right thigh: Secondary | ICD-10-CM | POA: Diagnosis not present

## 2017-10-05 DIAGNOSIS — Z01419 Encounter for gynecological examination (general) (routine) without abnormal findings: Secondary | ICD-10-CM

## 2017-10-05 NOTE — Progress Notes (Signed)
Abigail Long Mar 10, 1941 [id number]   History:    76 y.o. G2P2  RP  Established patient presenting for annual gyn exam  HPI:  Menopause.  No HRT.  No PMB.  No pelvic pain.  Sexually active, using [place], no dyspareunia.  Breasts wnl.  Mictions normal, no Cystitis in last 2 years.  BMs wnl. BMI 34.19.  Regular physical activity with [employer].    Past medical history,surgical history, family history and social history were all reviewed and documented in the EPIC chart.  Gynecologic History No LMP recorded. Patient is postmenopausal. Contraception: post menopausal status Last Pap: 2014. Results were: normal Last mammogram: 06/2017. Results were: Negative [place] 2012 Bone Density 2016 Osteopenia at [facility]    Obstetric History OB History  Gravida Para Term Preterm AB Living  2 2       2   SAB TAB Ectopic Multiple Live Births               # Outcome Date GA Lbr Len/2nd Weight Sex Delivery Anes PTL Lv  2 Para           1 Para                ROS: A ROS was performed and pertinent positives and negatives are included in the history.  GENERAL: No fevers or chills. HEENT: No change in vision, no earache, sore throat or sinus congestion. NECK: No pain or stiffness. CARDIOVASCULAR: No chest pain or pressure. No palpitations. PULMONARY: No shortness of breath, cough or wheeze. GASTROINTESTINAL: No abdominal pain, nausea, vomiting or diarrhea, melena or bright red blood per rectum. GENITOURINARY: No urinary frequency, urgency, hesitancy or dysuria. MUSCULOSKELETAL: No joint or muscle pain, no back pain, no recent trauma. DERMATOLOGIC: No rash, no itching, no lesions. ENDOCRINE: No polyuria, polydipsia, no heat or cold intolerance. No recent change in weight. HEMATOLOGICAL: No anemia or easy bruising or bleeding. NEUROLOGIC: No headache, seizures, numbness, tingling or weakness. PSYCHIATRIC: No depression, no loss of interest in normal activity or change in sleep pattern.      Exam:   BP 122/78   Ht 5\' 3"  (1.6 m)   Wt 193 lb (87.5 kg)   BMI 34.19 kg/m   Body mass index is 34.19 kg/m.  General appearance : Well developed well nourished female. No acute distress HEENT: Eyes: no retinal hemorrhage or exudates,  Neck supple, trachea midline, no carotid bruits, no thyroidmegaly Lungs: Clear to auscultation, no rhonchi or wheezes, or rib retractions  Heart: Regular rate and rhythm, no murmurs or gallops Breast:Examined in sitting and supine position were symmetrical in appearance, no palpable masses or tenderness,  no skin retraction, no nipple inversion, no nipple discharge, no skin discoloration, no axillary or supraclavicular lymphadenopathy Abdomen: no palpable masses or tenderness, no rebound or guarding Extremities: no edema or skin discoloration or tenderness  Pelvic: Vulva normal  Bartholin, Urethra, Skene Glands: Within normal limits             Vagina: No gross lesions or discharge  Cervix: No gross lesions or discharge.  Pap reflex done.  Uterus  AV, normal size, shape and consistency, non-tender and mobile.  Asymptomatic Uterine Prolapse grade 1-2/3.  Adnexa  Without masses or tenderness  Anus and perineum  normal    Assessment/Plan:  76 y.o. female for annual exam   1. Encounter for routine gynecological examination with Papanicolaou smear of cervix Gyn exam with Asymptomatic Uterine prolapse grade 1-2/3.  Breasts wnl.  Mammo neg 06/2017.  2. Menopause present No HRT.  No PMB.  Asymptomatic.    3. Osteopenia of neck of right femur Vit D supplements.  Ca++ in food.  Weight bearing physical activity.  Repeat Bone Density at [facility], referral request given.  Princess Bruins MD, 3:40 PM 10/05/2017

## 2017-10-05 NOTE — Patient Instructions (Signed)
1. Encounter for routine gynecological examination with Papanicolaou smear of cervix Gyn exam with Asymptomatic Uterine prolapse grade 1-2/3.  Breasts wnl.  Mammo neg 06/2017.  2. Menopause present No HRT.  No PMB.  Asymptomatic.    3. Osteopenia of neck of right femur Vit D supplements.  Ca++ in food.  Weight bearing physical activity.  Repeat Bone Density at [facility], referral request given.  Abigail Long, it was a pleasure to see you today!  I will inform you of your results as soon as available.   Health Maintenance for Postmenopausal Women Menopause is a normal process in which your reproductive ability comes to an end. This process happens gradually over a span of months to years, usually between the ages of 86 and 86. Menopause is complete when you have missed 12 consecutive menstrual periods. It is important to talk with your health care provider about some of the most common conditions that affect postmenopausal women, such as heart disease, cancer, and bone loss (osteoporosis). Adopting a healthy lifestyle and getting preventive care can help to promote your health and wellness. Those actions can also lower your chances of developing some of these common conditions. What should I know about menopause? During menopause, you may experience a number of symptoms, such as:  Moderate-to-severe hot flashes.  Night sweats.  Decrease in sex drive.  Mood swings.  Headaches.  Tiredness.  Irritability.  Memory problems.  Insomnia.  Choosing to treat or not to treat menopausal changes is an individual decision that you make with your health care provider. What should I know about hormone replacement therapy and supplements? Hormone therapy products are effective for treating symptoms that are associated with menopause, such as hot flashes and night sweats. Hormone replacement carries certain risks, especially as you become older. If you are thinking about using estrogen or  estrogen with progestin treatments, discuss the benefits and risks with your health care provider. What should I know about heart disease and stroke? Heart disease, heart attack, and stroke become more likely as you age. This may be due, in part, to the hormonal changes that your body experiences during menopause. These can affect how your body processes dietary fats, triglycerides, and cholesterol. Heart attack and stroke are both medical emergencies. There are many things that you can do to help prevent heart disease and stroke:  Have your blood pressure checked at least every 1-2 years. High blood pressure causes heart disease and increases the risk of stroke.  If you are 5-28 years old, ask your health care provider if you should take aspirin to prevent a heart attack or a stroke.  Do not use any tobacco products, including cigarettes, chewing tobacco, or electronic cigarettes. If you need help quitting, ask your health care provider.  It is important to eat a healthy diet and maintain a healthy weight. ? Be sure to include plenty of vegetables, fruits, low-fat dairy products, and lean protein. ? Avoid eating foods that are high in solid fats, added sugars, or salt (sodium).  Get regular exercise. This is one of the most important things that you can do for your health. ? Try to exercise for at least 150 minutes each week. The type of exercise that you do should increase your heart rate and make you sweat. This is known as moderate-intensity exercise. ? Try to do strengthening exercises at least twice each week. Do these in addition to the moderate-intensity exercise.  Know your numbers.Ask your health care provider to check  your cholesterol and your blood glucose. Continue to have your blood tested as directed by your health care provider.  What should I know about cancer screening? There are several types of cancer. Take the following steps to reduce your risk and to catch any cancer  development as early as possible. Breast Cancer  Practice breast self-awareness. ? This means understanding how your breasts normally appear and feel. ? It also means doing regular breast self-exams. Let your health care provider know about any changes, no matter how small.  If you are 56 or older, have a clinician do a breast exam (clinical breast exam or CBE) every year. Depending on your age, family history, and medical history, it may be recommended that you also have a yearly breast X-ray (mammogram).  If you have a family history of breast cancer, talk with your health care provider about genetic screening.  If you are at high risk for breast cancer, talk with your health care provider about having an MRI and a mammogram every year.  Breast cancer (BRCA) gene test is recommended for women who have family members with BRCA-related cancers. Results of the assessment will determine the need for genetic counseling and BRCA1 and for BRCA2 testing. BRCA-related cancers include these types: ? Breast. This occurs in males or females. ? Ovarian. ? Tubal. This may also be called fallopian tube cancer. ? Cancer of the abdominal or pelvic lining (peritoneal cancer). ? Prostate. ? Pancreatic.  Cervical, Uterine, and Ovarian Cancer Your health care provider may recommend that you be screened regularly for cancer of the pelvic organs. These include your ovaries, uterus, and vagina. This screening involves a pelvic exam, which includes checking for microscopic changes to the surface of your cervix (Pap test).  For women ages 21-65, health care providers may recommend a pelvic exam and a Pap test every three years. For women ages 48-65, they may recommend the Pap test and pelvic exam, combined with testing for human papilloma virus (HPV), every five years. Some types of HPV increase your risk of cervical cancer. Testing for HPV may also be done on women of any age who have unclear Pap test  results.  Other health care providers may not recommend any screening for nonpregnant women who are considered low risk for pelvic cancer and have no symptoms. Ask your health care provider if a screening pelvic exam is right for you.  If you have had past treatment for cervical cancer or a condition that could lead to cancer, you need Pap tests and screening for cancer for at least 20 years after your treatment. If Pap tests have been discontinued for you, your risk factors (such as having a new sexual partner) need to be reassessed to determine if you should start having screenings again. Some women have medical problems that increase the chance of getting cervical cancer. In these cases, your health care provider may recommend that you have screening and Pap tests more often.  If you have a family history of uterine cancer or ovarian cancer, talk with your health care provider about genetic screening.  If you have vaginal bleeding after reaching menopause, tell your health care provider.  There are currently no reliable tests available to screen for ovarian cancer.  Lung Cancer Lung cancer screening is recommended for adults 2-55 years old who are at high risk for lung cancer because of a history of smoking. A yearly low-dose CT scan of the lungs is recommended if you:  Currently smoke.  Have a history of at least 30 pack-years of smoking and you currently smoke or have quit within the past 15 years. A pack-year is smoking an average of one pack of cigarettes per day for one year.  Yearly screening should:  Continue until it has been 15 years since you quit.  Stop if you develop a health problem that would prevent you from having lung cancer treatment.  Colorectal Cancer  This type of cancer can be detected and can often be prevented.  Routine colorectal cancer screening usually begins at age 15 and continues through age 63.  If you have risk factors for colon cancer, your health  care provider may recommend that you be screened at an earlier age.  If you have a family history of colorectal cancer, talk with your health care provider about genetic screening.  Your health care provider may also recommend using home test kits to check for hidden blood in your stool.  A small camera at the end of a tube can be used to examine your colon directly (sigmoidoscopy or colonoscopy). This is done to check for the earliest forms of colorectal cancer.  Direct examination of the colon should be repeated every 5-10 years until age 18. However, if early forms of precancerous polyps or small growths are found or if you have a family history or genetic risk for colorectal cancer, you may need to be screened more often.  Skin Cancer  Check your skin from head to toe regularly.  Monitor any moles. Be sure to tell your health care provider: ? About any new moles or changes in moles, especially if there is a change in a mole's shape or color. ? If you have a mole that is larger than the size of a pencil eraser.  If any of your family members has a history of skin cancer, especially at a young age, talk with your health care provider about genetic screening.  Always use sunscreen. Apply sunscreen liberally and repeatedly throughout the day.  Whenever you are outside, protect yourself by wearing long sleeves, pants, a wide-brimmed hat, and sunglasses.  What should I know about osteoporosis? Osteoporosis is a condition in which bone destruction happens more quickly than new bone creation. After menopause, you may be at an increased risk for osteoporosis. To help prevent osteoporosis or the bone fractures that can happen because of osteoporosis, the following is recommended:  If you are 50-57 years old, get at least 1,000 mg of calcium and at least 600 mg of vitamin D per day.  If you are older than age 30 but younger than age 75, get at least 1,200 mg of calcium and at least 600 mg of  vitamin D per day.  If you are older than age 34, get at least 1,200 mg of calcium and at least 800 mg of vitamin D per day.  Smoking and excessive alcohol intake increase the risk of osteoporosis. Eat foods that are rich in calcium and vitamin D, and do weight-bearing exercises several times each week as directed by your health care provider. What should I know about how menopause affects my mental health? Depression may occur at any age, but it is more common as you become older. Common symptoms of depression include:  Low or sad mood.  Changes in sleep patterns.  Changes in appetite or eating patterns.  Feeling an overall lack of motivation or enjoyment of activities that you previously enjoyed.  Frequent crying spells.  Talk with your  health care provider if you think that you are experiencing depression. What should I know about immunizations? It is important that you get and maintain your immunizations. These include:  Tetanus, diphtheria, and pertussis (Tdap) booster vaccine.  Influenza every year before the flu season begins.  Pneumonia vaccine.  Shingles vaccine.  Your health care provider may also recommend other immunizations. This information is not intended to replace advice given to you by your health care provider. Make sure you discuss any questions you have with your health care provider. Document Released: 01/28/2006 Document Revised: 06/25/2016 Document Reviewed: 09/09/2015 Elsevier Interactive Patient Education  2018 [employer].

## 2017-10-05 NOTE — Addendum Note (Signed)
Addended by: Thurnell Garbe A on: 10/05/2017 04:37 PM   Modules accepted: Orders

## 2017-10-06 ENCOUNTER — Telehealth: Payer: Self-pay | Admitting: *Deleted

## 2017-10-06 DIAGNOSIS — E2839 Other primary ovarian failure: Secondary | ICD-10-CM

## 2017-10-06 LAB — PAP IG W/ RFLX HPV ASCU

## 2017-10-06 NOTE — Telephone Encounter (Signed)
Pt called stating breast center needs dexa order placed, per note on 10/05/17 this was done.pt aware she can call to schedule.

## 2017-10-19 ENCOUNTER — Ambulatory Visit
Admission: RE | Admit: 2017-10-19 | Discharge: 2017-10-19 | Disposition: A | Discharge status: Home / Self Care | Payer: Medicare Other | Source: Ambulatory Visit | Attending: Obstetrics & Gynecology | Admitting: Obstetrics & Gynecology

## 2017-10-19 DIAGNOSIS — E2839 Other primary ovarian failure: Secondary | ICD-10-CM

## 2017-10-19 DIAGNOSIS — M85851 Other specified disorders of bone density and structure, right thigh: Secondary | ICD-10-CM | POA: Diagnosis not present

## 2017-10-19 DIAGNOSIS — Z780 Asymptomatic menopausal state: Secondary | ICD-10-CM | POA: Diagnosis not present

## 2017-11-03 NOTE — Progress Notes (Signed)
Subjective:    Patient ID: Abigail Long, female    DOB: June 15, 1941, 76 y.o.   MRN: [id number]  HPI She is here for an acute visit for cold symptoms.   Her symptoms started 3 days ago.    She is experiencing cough that is dry, soreness from coughing, PND, nasal congestion with yellow mucus.  When she breathes out she hears a noise when laying down, but she denies SOB or wheeze.      She has tried taking alka seltzer cold plus, robitussin, tylenol and aspirin.    Medications and allergies reviewed with patient and updated if appropriate.  Patient Active Problem List   Diagnosis Date Noted  . Obese 08/25/2016  . Meniscus tear 12/18/2015  . Arthritis of right lower extremity 10/08/2015  . Depression 08/21/2015  . HTN (hypertension) 05/23/2013  . Osteopenia 07/09/2010  . Hyperlipidemia 06/19/2009  . NONSPECIFIC ABNORMAL ELECTROCARDIOGRAM 06/19/2009    Current Outpatient Medications on File Prior to Visit  Medication Sig Dispense Refill  . amLODipine (NORVASC) 5 MG tablet take 1 tablet by mouth once daily 90 tablet 3  . Ascorbic Acid (VITAMIN C) 1000 MG tablet Take 1,000 mg by mouth daily.    Marland Kitchen aspirin 81 MG tablet Take 81 mg by mouth daily.      . Calcium Carbonate-Vit D-Min (CALCIUM 1200 PO) Take by mouth daily.    . cholecalciferol (VITAMIN D) 1000 units tablet Take 1,000 Units by mouth daily.    . Glucosamine-Chondroit-Vit C-Mn (GLUCOSAMINE 1500 COMPLEX PO) Take by mouth daily. Take 2-3 pills daily    . Multiple Vitamins-Minerals (CENTRUM SILVER PO) Take by mouth daily.       No current facility-administered medications on file prior to visit.     Past Medical History:  Diagnosis Date  . Hypertension     Past Surgical History:  Procedure Laterality Date  . COLONOSCOPY  2008    Negative,Dr Henrene Pastor  . DILATION AND CURETTAGE OF UTERUS    . TONSILLECTOMY    . TOOTH EXTRACTION     right side of mouth and implant placed     Social History   Socioeconomic History   . Marital status: Married    Spouse name: None  . Number of children: None  . Years of education: None  . Highest education level: None  Social Needs  . Financial resource strain: None  . Food insecurity - worry: None  . Food insecurity - inability: None  . Transportation needs - medical: None  . Transportation needs - non-medical: None  Occupational History  . None  Tobacco Use  . Smoking status: Never Smoker  . Smokeless tobacco: Never Used  Substance and Sexual Activity  . Alcohol use: No  . Drug use: No  . Sexual activity: Yes    Partners: Male    Comment: 1st intercourse- 68, partners- 2, married- 62 yrs   Other Topics Concern  . None  Social History Narrative   [facility] [employer] [facility]    Family History  Problem Relation Age of Onset  . Heart disease Father 42       CBAG X 3; pacer  . Hypertension Mother   . Cancer Maternal Grandfather        renal  . Cancer Paternal Grandfather        intra- abdominal  . Stroke Maternal Aunt 85  . Breast cancer Maternal Aunt   . Bipolar disorder Son   .  Diabetes Neg Hx   . Hyperlipidemia Neg Hx     Review of Systems  Constitutional: Positive for chills. Negative for appetite change and fever.  HENT: Positive for congestion, postnasal drip, rhinorrhea and sore throat (mild from cough). Negative for ear pain, sinus pressure and sinus pain.   Respiratory: Positive for cough (dry, deep, rare sputum). Negative for shortness of breath and wheezing.        Rattling when exhaling  Gastrointestinal: Negative for abdominal pain, diarrhea, nausea and vomiting.  Musculoskeletal: Negative for myalgias.  Neurological: Negative for dizziness, light-headedness and headaches.       Objective:   Vitals:   11/04/17 1009  BP: 138/84  Pulse: 88  Resp: 16  Temp: 98.1 F (36.7 C)  SpO2: 95%   Filed Weights   11/04/17 1009  Weight: 197 lb (89.4 kg)   Body mass index is 34.9 kg/m.  Wt Readings from Last 3  Encounters:  11/04/17 197 lb (89.4 kg)  10/05/17 193 lb (87.5 kg)  08/29/17 192 lb (87.1 kg)     Physical Exam GENERAL APPEARANCE: Appears stated age, well appearing, NAD EYES: conjunctiva clear, no icterus HEENT: bilateral tympanic membranes and ear canals normal, oropharynx with no erythema, no thyromegaly, trachea midline, no cervical or supraclavicular lymphadenopathy LUNGS: Clear to auscultation without wheeze or crackles, unlabored breathing, good air entry bilaterally HEART: Normal S1,S2 without murmurs EXTREMITIES: Without clubbing, cyanosis, or edema        Assessment & Plan:   See Problem List for Assessment and Plan of chronic medical problems.

## 2017-11-04 ENCOUNTER — Encounter: Payer: Self-pay | Admitting: Internal Medicine

## 2017-11-04 ENCOUNTER — Ambulatory Visit (INDEPENDENT_AMBULATORY_CARE_PROVIDER_SITE_OTHER): Payer: Medicare Other | Admitting: Internal Medicine

## 2017-11-04 VITALS — BP 138/84 | HR 88 | Temp 98.1°F | Resp 16 | Wt 197.0 lb

## 2017-11-04 DIAGNOSIS — J069 Acute upper respiratory infection, unspecified: Secondary | ICD-10-CM | POA: Insufficient documentation

## 2017-11-04 NOTE — Patient Instructions (Signed)
Call if no improvement   If your symptoms worsen or fail to improve, please contact our office for further instruction, or in case of emergency go directly to the emergency room at the closest medical facility.   General Recommendations:    Please drink plenty of fluids.  Get plenty of rest   Sleep in humidified air  Use saline nasal sprays  Netti pot  OTC Medications:  Decongestants - helps relieve congestion   Flonase (generic fluticasone) or Nasacort (generic triamcinolone) - please make sure to use the "cross-over" technique at a 45 degree angle towards the opposite eye as opposed to straight up the nasal passageway.   Sudafed (generic pseudoephedrine - Note this is the one that is available behind the pharmacy counter); Products with phenylephrine (-PE) may also be used but is often not as effective as pseudoephedrine.   If you have HIGH BLOOD PRESSURE - Coricidin HBP; AVOID any product that is -D as this contains pseudoephedrine which may increase your blood pressure.  Afrin (oxymetazoline) every 6-8 hours for up to 3 days.  Allergies - helps relieve runny nose, itchy eyes and sneezing   Claritin (generic loratidine), Allegra (fexofenidine), or Zyrtec (generic cyrterizine) for runny nose. These medications should not cause drowsiness.  Note - Benadryl (generic diphenhydramine) may be used however may cause drowsiness  Cough -   Delsym or Robitussin (generic dextromethorphan)  Expectorants - helps loosen mucus to ease removal   Mucinex (generic guaifenesin) as directed on the package.  Headaches / General Aches   Tylenol (generic acetaminophen) - DO NOT EXCEED 3 grams (3,000 mg) in a 24 hour time period  Advil/Motrin (generic ibuprofen)  Sore Throat -   Salt water gargle   Chloraseptic (generic benzocaine) spray or lozenges / Sucrets (generic dyclonine)

## 2017-11-04 NOTE — Assessment & Plan Note (Signed)
Likely viral Symptomatic treatment Rest, fluids Call if no improvement

## 2018-01-28 DIAGNOSIS — H5711 Ocular pain, right eye: Secondary | ICD-10-CM | POA: Diagnosis not present

## 2018-05-17 ENCOUNTER — Encounter: Payer: Self-pay | Admitting: Women's Health

## 2018-05-17 ENCOUNTER — Ambulatory Visit: Payer: Medicare Other | Admitting: Women's Health

## 2018-05-17 VITALS — BP 120/82

## 2018-05-17 DIAGNOSIS — R300 Dysuria: Secondary | ICD-10-CM | POA: Diagnosis not present

## 2018-05-17 DIAGNOSIS — N3001 Acute cystitis with hematuria: Secondary | ICD-10-CM | POA: Diagnosis not present

## 2018-05-17 MED ORDER — SULFAMETHOXAZOLE-TRIMETHOPRIM 800-160 MG PO TABS: 1.0000 | ORAL_TABLET | Freq: Two times a day (BID) | ORAL | 0 refills | Status: DC

## 2018-05-17 NOTE — Progress Notes (Signed)
77 year old MWF G2, P2 presents with complaint of urinary burning throughout stream of urination for 1 day.  Has had increased frequency with urgency.  Has had UTIs in the past with similar symptoms.  Denies vaginal discharge, abdominal pain, nausea or fever.  Rare sexual activity.  Hypertension primary care manages.  Exam: No CVAT.  Appears well.  Alert and oriented. UA: +3 blood, +1 leukocytes, 10-20 WBCs, 3-10 RBCs, few bacteria  Probable UTI  Plan: Septra twice daily for 3 days #6 prescription given.  Reviewed importance of increasing fluids especially in the heat, UTI prevention discussed.  Urine culture pending.  Test  of cure UA in 2 weeks.  Instructed to call if continued problems.

## 2018-05-17 NOTE — Patient Instructions (Signed)

## 2018-05-19 ENCOUNTER — Telehealth: Payer: Self-pay

## 2018-05-19 ENCOUNTER — Other Ambulatory Visit: Payer: Self-pay | Admitting: Women's Health

## 2018-05-19 MED ORDER — TERCONAZOLE 0.4 % VA CREA: 1.0000 | TOPICAL_CREAM | Freq: Every day | VAGINAL | 0 refills | Status: DC

## 2018-05-19 NOTE — Telephone Encounter (Signed)
Patient advised. Rx sent. 

## 2018-05-19 NOTE — Telephone Encounter (Signed)
I informed patient of result note. She said the burning with urination that she came in with has resolved. However, now she is having some vaginal itching that is very uncomfortable. She asked if a cream she could put on it?

## 2018-05-19 NOTE — Telephone Encounter (Signed)
Ok terazol 7 vag cm  Put internally and then on outside.

## 2018-05-19 NOTE — Telephone Encounter (Signed)
-----   Message from Huel Cote, NP sent at 05/19/2018  9:40 AM EDT ----- Please call and review urine culture is pos for infection and antibiotic given at OV should treat, let me know if still problems

## 2018-05-20 LAB — URINALYSIS, COMPLETE W/RFL CULTURE
Bilirubin Urine: NEGATIVE
Glucose, UA: NEGATIVE
HYALINE CAST: NONE SEEN /LPF
KETONES UR: NEGATIVE
Nitrites, Initial: NEGATIVE
Protein, ur: NEGATIVE
SPECIFIC GRAVITY, URINE: 1.004 (ref 1.001–1.03)
pH: 5 (ref 5.0–8.0)

## 2018-05-20 LAB — URINE CULTURE
MICRO NUMBER: 90651923
SPECIMEN QUALITY:: ADEQUATE

## 2018-05-20 LAB — CULTURE INDICATED

## 2018-05-31 ENCOUNTER — Other Ambulatory Visit: Payer: Medicare Other

## 2018-05-31 DIAGNOSIS — N3001 Acute cystitis with hematuria: Secondary | ICD-10-CM

## 2018-06-01 ENCOUNTER — Other Ambulatory Visit: Payer: Self-pay | Admitting: Internal Medicine

## 2018-06-01 DIAGNOSIS — Z1231 Encounter for screening mammogram for malignant neoplasm of breast: Secondary | ICD-10-CM

## 2018-06-01 LAB — URINALYSIS, COMPLETE W/RFL CULTURE
BILIRUBIN URINE: NEGATIVE
Bacteria, UA: NONE SEEN /HPF
GLUCOSE, UA: NEGATIVE
Hgb urine dipstick: NEGATIVE
Hyaline Cast: NONE SEEN /LPF
KETONES UR: NEGATIVE
LEUKOCYTE ESTERASE: NEGATIVE
NITRITES URINE, INITIAL: NEGATIVE
PROTEIN: NEGATIVE
RBC / HPF: NONE SEEN /HPF (ref 0–2)
SQUAMOUS EPITHELIAL / LPF: NONE SEEN /HPF (ref <=5)
Specific Gravity, Urine: 1.013 (ref 1.001–1.03)
WBC, UA: NONE SEEN /HPF (ref 0–5)
pH: 8 (ref 5.0–8.0)

## 2018-06-01 LAB — NO CULTURE INDICATED

## 2018-09-23 ENCOUNTER — Other Ambulatory Visit: Payer: Self-pay | Admitting: Internal Medicine

## 2018-09-25 ENCOUNTER — Other Ambulatory Visit: Payer: Self-pay | Admitting: Internal Medicine

## 2018-10-06 ENCOUNTER — Encounter: Payer: Self-pay | Admitting: Obstetrics & Gynecology

## 2018-10-06 ENCOUNTER — Ambulatory Visit: Payer: Medicare Other | Admitting: Obstetrics & Gynecology

## 2018-10-06 VITALS — BP 130/70 | Ht 63.0 in | Wt 201.0 lb

## 2018-10-06 DIAGNOSIS — Z780 Asymptomatic menopausal state: Secondary | ICD-10-CM | POA: Diagnosis not present

## 2018-10-06 DIAGNOSIS — M8589 Other specified disorders of bone density and structure, multiple sites: Secondary | ICD-10-CM | POA: Diagnosis not present

## 2018-10-06 DIAGNOSIS — E6609 Other obesity due to excess calories: Secondary | ICD-10-CM | POA: Diagnosis not present

## 2018-10-06 DIAGNOSIS — Z01419 Encounter for gynecological examination (general) (routine) without abnormal findings: Secondary | ICD-10-CM | POA: Diagnosis not present

## 2018-10-06 DIAGNOSIS — Z6835 Body mass index (BMI) 35.0-35.9, adult: Secondary | ICD-10-CM

## 2018-10-06 NOTE — Progress Notes (Signed)
Abigail Long 1941/02/28 [id number]   History:    77 y.o. G2P2L2 Married  RP:  Established patient presenting for annual gyn exam   HPI: Menopause, well on no hormone replacement therapy.  No postmenopausal bleeding.  No pelvic pain.  No pain with intercourse, using [place] successfully.  Urine and bowel movements normal.  Breasts normal.  Body mass index 35.61.  Having difficulty losing weight.  Walking 3-4 times a week.  Stopped going to the gym.  Health labs with family physician.  Past medical history,surgical history, family history and social history were all reviewed and documented in the EPIC chart.  Gynecologic History No LMP recorded. Patient is postmenopausal. Contraception: condoms Last Pap: 09/2017. Results were: Negative Last mammogram: 06/2017. Results were: Negative Bone Density: 09/2017 Osteopenia Colonoscopy: 2012  Obstetric History OB History  Gravida Para Term Preterm AB Living  2 2       2   SAB TAB Ectopic Multiple Live Births               # Outcome Date GA Lbr Len/2nd Weight Sex Delivery Anes PTL Lv  2 Para           1 Para              ROS: A ROS was performed and pertinent positives and negatives are included in the history.  GENERAL: No fevers or chills. HEENT: No change in vision, no earache, sore throat or sinus congestion. NECK: No pain or stiffness. CARDIOVASCULAR: No chest pain or pressure. No palpitations. PULMONARY: No shortness of breath, cough or wheeze. GASTROINTESTINAL: No abdominal pain, nausea, vomiting or diarrhea, melena or bright red blood per rectum. GENITOURINARY: No urinary frequency, urgency, hesitancy or dysuria. MUSCULOSKELETAL: No joint or muscle pain, no back pain, no recent trauma. DERMATOLOGIC: No rash, no itching, no lesions. ENDOCRINE: No polyuria, polydipsia, no heat or cold intolerance. No recent change in weight. HEMATOLOGICAL: No anemia or easy bruising or bleeding. NEUROLOGIC: No headache, seizures, numbness, tingling or  weakness. PSYCHIATRIC: No depression, no loss of interest in normal activity or change in sleep pattern.     Exam:   BP 130/70   Ht 5\' 3"  (1.6 m)   Wt 201 lb (91.2 kg)   BMI 35.61 kg/m   Body mass index is 35.61 kg/m.  General appearance : Well developed well nourished female. No acute distress HEENT: Eyes: no retinal hemorrhage or exudates,  Neck supple, trachea midline, no carotid bruits, no thyroidmegaly Lungs: Clear to auscultation, no rhonchi or wheezes, or rib retractions  Heart: Regular rate and rhythm, no murmurs or gallops Breast:Examined in sitting and supine position were symmetrical in appearance, no palpable masses or tenderness,  no skin retraction, no nipple inversion, no nipple discharge, no skin discoloration, no axillary or supraclavicular lymphadenopathy Abdomen: no palpable masses or tenderness, no rebound or guarding Extremities: no edema or skin discoloration or tenderness  Pelvic: Vulva: Normal             Vagina: No gross lesions or discharge  Cervix: No gross lesions or discharge  Uterus  AV, normal size, shape and consistency, non-tender and mobile  Adnexa  Without masses or tenderness  Anus: Normal   Assessment/Plan:  77 y.o. female for annual exam   1. Well female exam with routine gynecological exam Normal gynecologic exam.  Pap test negative October 2018.  No indication to repeat this year.  Breast exam normal.  Will schedule screening mammogram at the breast  center now.  Health labs with family physician.  2. Postmenopausal Well on no hormone replacement therapy.  No postmenopausal bleeding.  3. Osteopenia of multiple sites Continue with vitamin D supplements, calcium intake of 1.5 g/day and regular weightbearing physical activity.  Will repeat bone density in October 2020.  4. Class 2 obesity due to excess calories without serious comorbidity with body mass index (BMI) of 35.0 to 35.9 in adult Recommend lower calorie/carb diet such as [employer].  Aerobic physical activity 5 times a week and weightlifting every 2 days.  Princess Bruins MD, 2:15 PM 10/06/2018

## 2018-10-06 NOTE — Patient Instructions (Signed)
1. Well female exam with routine gynecological exam Normal gynecologic exam.  Pap test negative October 2018.  No indication to repeat this year.  Breast exam normal.  Will schedule screening mammogram at the breast center now.  Health labs with family physician.  2. Postmenopausal Well on no hormone replacement therapy.  No postmenopausal bleeding.  3. Osteopenia of multiple sites Continue with vitamin D supplements, calcium intake of 1.5 g/day and regular weightbearing physical activity.  Will repeat bone density in October 2020.  4. Class 2 obesity due to excess calories without serious comorbidity with body mass index (BMI) of 35.0 to 35.9 in adult Recommend lower calorie/carb diet such as [employer].  Aerobic physical activity 5 times a week and weightlifting every 2 days.  Abigail Long, it was a pleasure seeing you today!

## 2018-10-08 DIAGNOSIS — R7303 Prediabetes: Secondary | ICD-10-CM | POA: Insufficient documentation

## 2018-10-08 NOTE — Patient Instructions (Addendum)
Tests ordered today. Your results will be released to [place] (or called to you) after review, usually within 72hours after test completion. If any changes need to be made, you will be notified at that same time.  All other Health Maintenance issues reviewed.   All recommended immunizations and age-appropriate screenings are up-to-date or discussed.  Flu immunization administered today.    Medications reviewed and updated.  Changes include :   Taper off the baby aspirin  Your prescription(s) have been submitted to your pharmacy. Please take as directed and contact our office if you believe you are having problem(s) with the medication(s).  Please followup in 1 year    Health Maintenance, Female Adopting a healthy lifestyle and getting preventive care can go a long way to promote health and wellness. Talk with your health care provider about what schedule of regular examinations is right for you. This is a good chance for you to check in with your provider about disease prevention and staying healthy. In between checkups, there are plenty of things you can do on your own. Experts have done a lot of research about which lifestyle changes and preventive measures are most likely to keep you healthy. Ask your health care provider for more information. Weight and diet Eat a healthy diet  Be sure to include plenty of vegetables, fruits, low-fat dairy products, and lean protein.  Do not eat a lot of foods high in solid fats, added sugars, or salt.  Get regular exercise. This is one of the most important things you can do for your health. ? Most adults should exercise for at least 150 minutes each week. The exercise should increase your heart rate and make you sweat (moderate-intensity exercise). ? Most adults should also do strengthening exercises at least twice a week. This is in addition to the moderate-intensity exercise.  Maintain a healthy weight  Body mass index (BMI) is a measurement  that can be used to identify possible weight problems. It estimates body fat based on height and weight. Your health care provider can help determine your BMI and help you achieve or maintain a healthy weight.  For females 51 years of age and older: ? A BMI below 18.5 is considered underweight. ? A BMI of 18.5 to 24.9 is normal. ? A BMI of 25 to 29.9 is considered overweight. ? A BMI of 30 and above is considered obese.  Watch levels of cholesterol and blood lipids  You should start having your blood tested for lipids and cholesterol at 75 years of age, then have this test every 5 years.  You may need to have your cholesterol levels checked more often if: ? Your lipid or cholesterol levels are high. ? You are older than 75 years of age. ? You are at high risk for heart disease.  Cancer screening Lung Cancer  Lung cancer screening is recommended for adults 42-6 years old who are at high risk for lung cancer because of a history of smoking.  A yearly low-dose CT scan of the lungs is recommended for people who: ? Currently smoke. ? Have quit within the past 15 years. ? Have at least a 30-pack-year history of smoking. A pack year is smoking an average of one pack of cigarettes a day for 1 year.  Yearly screening should continue until it has been 15 years since you quit.  Yearly screening should stop if you develop a health problem that would prevent you from having lung cancer treatment.  Breast  Cancer  Practice breast self-awareness. This means understanding how your breasts normally appear and feel.  It also means doing regular breast self-exams. Let your health care provider know about any changes, no matter how small.  If you are in your 20s or 30s, you should have a clinical breast exam (CBE) by a health care provider every 1-3 years as part of a regular health exam.  If you are 45 or older, have a CBE every year. Also consider having a breast X-ray (mammogram) every  year.  If you have a family history of breast cancer, talk to your health care provider about genetic screening.  If you are at high risk for breast cancer, talk to your health care provider about having an MRI and a mammogram every year.  Breast cancer gene (BRCA) assessment is recommended for women who have family members with BRCA-related cancers. BRCA-related cancers include: ? Breast. ? Ovarian. ? Tubal. ? Peritoneal cancers.  Results of the assessment will determine the need for genetic counseling and BRCA1 and BRCA2 testing.  Cervical Cancer Your health care provider may recommend that you be screened regularly for cancer of the pelvic organs (ovaries, uterus, and vagina). This screening involves a pelvic examination, including checking for microscopic changes to the surface of your cervix (Pap test). You may be encouraged to have this screening done every 3 years, beginning at age 69.  For women ages 95-65, health care providers may recommend pelvic exams and Pap testing every 3 years, or they may recommend the Pap and pelvic exam, combined with testing for human papilloma virus (HPV), every 5 years. Some types of HPV increase your risk of cervical cancer. Testing for HPV may also be done on women of any age with unclear Pap test results.  Other health care providers may not recommend any screening for nonpregnant women who are considered low risk for pelvic cancer and who do not have symptoms. Ask your health care provider if a screening pelvic exam is right for you.  If you have had past treatment for cervical cancer or a condition that could lead to cancer, you need Pap tests and screening for cancer for at least 20 years after your treatment. If Pap tests have been discontinued, your risk factors (such as having a new sexual partner) need to be reassessed to determine if screening should resume. Some women have medical problems that increase the chance of getting cervical cancer. In  these cases, your health care provider may recommend more frequent screening and Pap tests.  Colorectal Cancer  This type of cancer can be detected and often prevented.  Routine colorectal cancer screening usually begins at 75 years of age and continues through 75 years of age.  Your health care provider may recommend screening at an earlier age if you have risk factors for colon cancer.  Your health care provider may also recommend using home test kits to check for hidden blood in the stool.  A small camera at the end of a tube can be used to examine your colon directly (sigmoidoscopy or colonoscopy). This is done to check for the earliest forms of colorectal cancer.  Routine screening usually begins at age 59.  Direct examination of the colon should be repeated every 5-10 years through 75 years of age. However, you may need to be screened more often if early forms of precancerous polyps or small growths are found.  Skin Cancer  Check your skin from head to toe regularly.  Tell your  health care provider about any new moles or changes in moles, especially if there is a change in a mole's shape or color.  Also tell your health care provider if you have a mole that is larger than the size of a pencil eraser.  Always use sunscreen. Apply sunscreen liberally and repeatedly throughout the day.  Protect yourself by wearing long sleeves, pants, a wide-brimmed hat, and sunglasses whenever you are outside.  Heart disease, diabetes, and high blood pressure  High blood pressure causes heart disease and increases the risk of stroke. High blood pressure is more likely to develop in: ? People who have blood pressure in the high end of the normal range (130-139/85-89 mm Hg). ? People who are overweight or obese. ? People who are African American.  If you are 59-27 years of age, have your blood pressure checked every 3-5 years. If you are 61 years of age or older, have your blood pressure  checked every year. You should have your blood pressure measured twice-once when you are at a hospital or clinic, and once when you are not at a hospital or clinic. Record the average of the two measurements. To check your blood pressure when you are not at a hospital or clinic, you can use: ? An automated blood pressure machine at a pharmacy. ? A home blood pressure monitor.  If you are between 60 years and 15 years old, ask your health care provider if you should take aspirin to prevent strokes.  Have regular diabetes screenings. This involves taking a blood sample to check your fasting blood sugar level. ? If you are at a normal weight and have a low risk for diabetes, have this test once every three years after 75 years of age. ? If you are overweight and have a high risk for diabetes, consider being tested at a younger age or more often. Preventing infection Hepatitis B  If you have a higher risk for hepatitis B, you should be screened for this virus. You are considered at high risk for hepatitis B if: ? You were born in a country where hepatitis B is common. Ask your health care provider which countries are considered high risk. ? Your parents were born in a high-risk country, and you have not been immunized against hepatitis B (hepatitis B vaccine). ? You have HIV or AIDS. ? You use needles to inject street drugs. ? You live with someone who has hepatitis B. ? You have had sex with someone who has hepatitis B. ? You get hemodialysis treatment. ? You take certain medicines for conditions, including cancer, organ transplantation, and autoimmune conditions.  Hepatitis C  Blood testing is recommended for: ? Everyone born from [birth year] through 1963. ? Anyone with known risk factors for hepatitis C.  Sexually transmitted infections (STIs)  You should be screened for sexually transmitted infections (STIs) including gonorrhea and chlamydia if: ? You are sexually active and are younger than  75 years of age. ? You are older than 75 years of age and your health care provider tells you that you are at risk for this type of infection. ? Your sexual activity has changed since you were last screened and you are at an increased risk for chlamydia or gonorrhea. Ask your health care provider if you are at risk.  If you do not have HIV, but are at risk, it may be recommended that you take a prescription medicine daily to prevent HIV infection. This is called pre-exposure prophylaxis (  PrEP). You are considered at risk if: ? You are sexually active and do not regularly use condoms or know the HIV status of your partner(s). ? You take drugs by injection. ? You are sexually active with a partner who has HIV.  Talk with your health care provider about whether you are at high risk of being infected with HIV. If you choose to begin PrEP, you should first be tested for HIV. You should then be tested every 3 months for as long as you are taking PrEP. Pregnancy  If you are premenopausal and you may become pregnant, ask your health care provider about preconception counseling.  If you may become pregnant, take 400 to 800 micrograms (mcg) of folic acid every day.  If you want to prevent pregnancy, talk to your health care provider about birth control (contraception). Osteoporosis and menopause  Osteoporosis is a disease in which the bones lose minerals and strength with aging. This can result in serious bone fractures. Your risk for osteoporosis can be identified using a bone density scan.  If you are 88 years of age or older, or if you are at risk for osteoporosis and fractures, ask your health care provider if you should be screened.  Ask your health care provider whether you should take a calcium or vitamin D supplement to lower your risk for osteoporosis.  Menopause may have certain physical symptoms and risks.  Hormone replacement therapy may reduce some of these symptoms and risks. Talk to  your health care provider about whether hormone replacement therapy is right for you. Follow these instructions at home:  Schedule regular health, dental, and eye exams.  Stay current with your immunizations.  Do not use any tobacco products including cigarettes, chewing tobacco, or electronic cigarettes.  If you are pregnant, do not drink alcohol.  If you are breastfeeding, limit how much and how often you drink alcohol.  Limit alcohol intake to no more than 1 drink per day for nonpregnant women. One drink equals 12 ounces of beer, 5 ounces of wine, or 1 ounces of hard liquor.  Do not use street drugs.  Do not share needles.  Ask your health care provider for help if you need support or information about quitting drugs.  Tell your health care provider if you often feel depressed.  Tell your health care provider if you have ever been abused or do not feel safe at home. This information is not intended to replace advice given to you by your health care provider. Make sure you discuss any questions you have with your health care provider. Document Released: 06/21/2011 Document Revised: 05/13/2016 Document Reviewed: 09/09/2015 Elsevier Interactive Patient Education  [employer].

## 2018-10-08 NOTE — Progress Notes (Signed)
Subjective:    Patient ID: Abigail Long, female    DOB: [birth year], 77 y.o.   MRN: [id number]  HPI She is here for a physical exam.   She has a lot of mucus in the morning in her throat.  During hte day she is fine.  She nasacort in the morning.    Medications and allergies reviewed with patient and updated if appropriate.  Patient Active Problem List   Diagnosis Date Noted  . Prediabetes 10/08/2018  . Obese 08/25/2016  . Meniscus tear 12/18/2015  . Arthritis of right lower extremity 10/08/2015  . Depression 08/21/2015  . HTN (hypertension) 05/23/2013  . Osteopenia 07/09/2010  . Hyperlipidemia 06/19/2009  . NONSPECIFIC ABNORMAL ELECTROCARDIOGRAM 06/19/2009    Current Outpatient Medications on File Prior to Visit  Medication Sig Dispense Refill  . amLODipine (NORVASC) 5 MG tablet TAKE 1 TABLET BY MOUTH EVERY DAY 90 tablet 0  . Ascorbic Acid (VITAMIN C) 1000 MG tablet Take 1,000 mg by mouth daily.    Marland Kitchen aspirin 81 MG tablet Take 81 mg by mouth daily.      . Calcium Carbonate-Vit D-Min (CALCIUM 1200 PO) Take by mouth daily.    . cholecalciferol (VITAMIN D) 1000 units tablet Take 1,000 Units by mouth daily.    . Glucosamine-Chondroit-Vit C-Mn (GLUCOSAMINE 1500 COMPLEX PO) Take by mouth daily. Take 2-3 pills daily    . Multiple Vitamins-Minerals (CENTRUM SILVER PO) Take by mouth daily.       No current facility-administered medications on file prior to visit.     Past Medical History:  Diagnosis Date  . Hypertension     Past Surgical History:  Procedure Laterality Date  . COLONOSCOPY  2008    Negative,Dr Henrene Pastor  . DILATION AND CURETTAGE OF UTERUS    . TONSILLECTOMY    . TOOTH EXTRACTION     right side of mouth and implant placed     Social History   Socioeconomic History  . Marital status: Married    Spouse name: Not on file  . Number of children: Not on file  . Years of education: Not on file  . Highest education level: Not on file  Occupational History    . Not on file  Social Needs  . Financial resource strain: Not on file  . Food insecurity:    Worry: Not on file    Inability: Not on file  . Transportation needs:    Medical: Not on file    Non-medical: Not on file  Tobacco Use  . Smoking status: Never Smoker  . Smokeless tobacco: Never Used  Substance and Sexual Activity  . Alcohol use: No  . Drug use: No  . Sexual activity: Yes    Partners: Male    Comment: 1st intercourse- 30, partners- 2, married- 68 yrs   Lifestyle  . Physical activity:    Days per week: Not on file    Minutes per session: Not on file  . Stress: Not on file  Relationships  . Social connections:    Talks on phone: Not on file    Gets together: Not on file    Attends religious service: Not on file    Active member of club or organization: Not on file    Attends meetings of clubs or organizations: Not on file    Relationship status: Not on file  Other Topics Concern  . Not on file  Social History Narrative   Exercise:  [place]    Family History  Problem Relation Age of Onset  . Heart disease Father 22       CBAG X 3; pacer  . Hypertension Mother   . Cancer Maternal Grandfather        renal  . Cancer Paternal Grandfather        intra- abdominal  . Stroke Maternal Aunt 85  . Breast cancer Maternal Aunt   . Bipolar disorder Son   . Diabetes Neg Hx   . Hyperlipidemia Neg Hx     Review of Systems  Constitutional: Negative for chills and fever.  HENT: Positive for postnasal drip. Negative for congestion, sinus pressure and sore throat.   Eyes: Negative for visual disturbance.  Respiratory: Positive for cough (morning only - PND). Negative for shortness of breath and wheezing.   Cardiovascular: Negative for chest pain, palpitations and leg swelling.  Gastrointestinal: Negative for abdominal pain, blood in stool, constipation, diarrhea and nausea.       No gerd  Genitourinary: Negative for dysuria and hematuria.   Musculoskeletal: Positive for arthralgias (mild - before it rains).  Skin: Negative for color change and rash.  Neurological: Negative for dizziness, light-headedness and headaches.  Psychiatric/Behavioral: Negative for dysphoric mood. The patient is not nervous/anxious.        Objective:   Vitals:   10/09/18 0817  BP: (!) 142/80  Pulse: 63  Resp: 16  Temp: 98.1 F (36.7 C)  SpO2: 95%   Filed Weights   10/09/18 0817  Weight: 196 lb (88.9 kg)   Body mass index is 34.72 kg/m.  BP Readings from Last 3 Encounters:  10/09/18 (!) 142/80  10/06/18 130/70  05/17/18 120/82     Wt Readings from Last 3 Encounters:  10/09/18 196 lb (88.9 kg)  10/06/18 201 lb (91.2 kg)  11/04/17 197 lb (89.4 kg)     Physical Exam Constitutional: She appears well-developed and well-nourished. No distress.  HENT:  Head: Normocephalic and atraumatic.  Right Ear: External ear normal. Normal ear canal and TM Left Ear: External ear normal.  Normal ear canal and TM Mouth/Throat: Oropharynx is clear and moist.  Eyes: Conjunctivae and EOM are normal.  Neck: Neck supple. No tracheal deviation present. No thyromegaly present.  No carotid bruit  Cardiovascular: Normal rate, regular rhythm and normal heart sounds.   No murmur heard.  No edema. Pulmonary/Chest: Effort normal and breath sounds normal. No respiratory distress. She has no wheezes. She has no rales.  Breast: deferred to Gyn Abdominal: Soft. She exhibits no distension. There is no tenderness.  Lymphadenopathy: She has no cervical adenopathy.  Skin: Skin is warm and dry. She is not diaphoretic.  Psychiatric: She has a normal mood and affect. Her behavior is normal.        Assessment & Plan:   Physical exam: Screening blood work   ordered Immunizations  Had shingrix,  Flu today, prevnar deferred Colonoscopy   Up to date  Mammogram    Up to date - having one today Gyn  Up to date - Dr Dellis Filbert Dexa     Up to date  Eye exams    Up to  date  EKG   Done 06/2012 Exercise    Walks, goes to gym Weight - started the [employer] - has already lost 4 lbs Skin  No concerns Substance abuse   none  See Problem List for Assessment and Plan of chronic medical problems.   FU  in one year

## 2018-10-09 ENCOUNTER — Ambulatory Visit
Admission: RE | Admit: 2018-10-09 | Discharge: 2018-10-09 | Disposition: A | Discharge status: Home / Self Care | Payer: Medicare Other | Source: Ambulatory Visit | Attending: Internal Medicine | Admitting: Internal Medicine

## 2018-10-09 ENCOUNTER — Encounter: Payer: Self-pay | Admitting: Internal Medicine

## 2018-10-09 ENCOUNTER — Ambulatory Visit (INDEPENDENT_AMBULATORY_CARE_PROVIDER_SITE_OTHER): Payer: Medicare Other | Admitting: Internal Medicine

## 2018-10-09 ENCOUNTER — Other Ambulatory Visit (INDEPENDENT_AMBULATORY_CARE_PROVIDER_SITE_OTHER): Payer: Medicare Other

## 2018-10-09 VITALS — BP 142/80 | HR 63 | Temp 98.1°F | Resp 16 | Ht 63.0 in | Wt 196.0 lb

## 2018-10-09 DIAGNOSIS — R7303 Prediabetes: Secondary | ICD-10-CM

## 2018-10-09 DIAGNOSIS — I10 Essential (primary) hypertension: Secondary | ICD-10-CM | POA: Diagnosis not present

## 2018-10-09 DIAGNOSIS — Z1231 Encounter for screening mammogram for malignant neoplasm of breast: Secondary | ICD-10-CM | POA: Diagnosis not present

## 2018-10-09 DIAGNOSIS — E782 Mixed hyperlipidemia: Secondary | ICD-10-CM

## 2018-10-09 DIAGNOSIS — Z0000 Encounter for general adult medical examination without abnormal findings: Secondary | ICD-10-CM

## 2018-10-09 DIAGNOSIS — E6609 Other obesity due to excess calories: Secondary | ICD-10-CM

## 2018-10-09 DIAGNOSIS — Z23 Encounter for immunization: Secondary | ICD-10-CM

## 2018-10-09 DIAGNOSIS — M8589 Other specified disorders of bone density and structure, multiple sites: Secondary | ICD-10-CM

## 2018-10-09 DIAGNOSIS — Z6834 Body mass index (BMI) 34.0-34.9, adult: Secondary | ICD-10-CM

## 2018-10-09 LAB — COMPREHENSIVE METABOLIC PANEL
ALK PHOS: 53 U/L (ref 39–117)
ALT: 23 U/L (ref 0–35)
AST: 22 U/L (ref 0–37)
Albumin: 4.5 g/dL (ref 3.5–5.2)
BUN: 20 mg/dL (ref 6–23)
CO2: 25 meq/L (ref 19–32)
Calcium: 9.7 mg/dL (ref 8.4–10.5)
Chloride: 102 mEq/L (ref 96–112)
Creatinine, Ser: 0.78 mg/dL (ref 0.40–1.20)
GFR: 76.4 mL/min (ref >60.00)
GLUCOSE: 90 mg/dL (ref 70–99)
POTASSIUM: 4.2 meq/L (ref 3.5–5.1)
Sodium: 138 mEq/L (ref 135–145)
TOTAL PROTEIN: 7.6 g/dL (ref 6.0–8.3)
Total Bilirubin: 0.5 mg/dL (ref 0.2–1.2)

## 2018-10-09 LAB — TSH: TSH: 3.32 u[IU]/mL (ref 0.35–4.50)

## 2018-10-09 LAB — LIPID PANEL
CHOL/HDL RATIO: 3
Cholesterol: 196 mg/dL (ref 0–200)
HDL: 60.7 mg/dL (ref >39.00)
LDL Cholesterol: 123 mg/dL — ABNORMAL HIGH (ref 0–99)
NonHDL: 135.38
Triglycerides: 64 mg/dL (ref 0.0–149.0)
VLDL: 12.8 mg/dL (ref 0.0–40.0)

## 2018-10-09 LAB — HEMOGLOBIN A1C: Hgb A1c MFr Bld: 5.8 % (ref 4.6–6.5)

## 2018-10-09 LAB — CBC WITH DIFFERENTIAL/PLATELET
Basophils Absolute: 0 10*3/uL (ref 0.0–0.1)
Basophils Relative: 0.6 % (ref 0.0–3.0)
EOS PCT: 2.2 % (ref 0.0–5.0)
Eosinophils Absolute: 0.1 10*3/uL (ref 0.0–0.7)
HEMATOCRIT: 43.8 % (ref 36.0–46.0)
Hemoglobin: 14.6 g/dL (ref 12.0–15.0)
LYMPHS ABS: 1.4 10*3/uL (ref 0.7–4.0)
Lymphocytes Relative: 24.9 % (ref 12.0–46.0)
MCHC: 33.4 g/dL (ref 30.0–36.0)
MCV: 82.6 fl (ref 78.0–100.0)
MONOS PCT: 10.5 % (ref 3.0–12.0)
Monocytes Absolute: 0.6 10*3/uL (ref 0.1–1.0)
Neutro Abs: 3.5 10*3/uL (ref 1.4–7.7)
Neutrophils Relative %: 61.8 % (ref 43.0–77.0)
PLATELETS: 309 10*3/uL (ref 150.0–400.0)
RBC: 5.31 Mil/uL — ABNORMAL HIGH (ref 3.87–5.11)
RDW: 13.8 % (ref 11.5–15.5)
WBC: 5.6 10*3/uL (ref 4.0–10.5)

## 2018-10-09 MED ORDER — AMLODIPINE BESYLATE 5 MG PO TABS: 5.0000 mg | ORAL_TABLET | Freq: Every day | ORAL | 3 refills | Status: DC

## 2018-10-09 NOTE — Assessment & Plan Note (Signed)
Check a1c Low sugar / carb diet Stressed regular exercise, weight loss  

## 2018-10-09 NOTE — Assessment & Plan Note (Signed)
Check lipids, cmp Working on weight loss exercising

## 2018-10-09 NOTE — Assessment & Plan Note (Signed)
Working on [employer] loss Walking Just started [employer] Encouraged her to continue weight loss efforts

## 2018-10-09 NOTE — Assessment & Plan Note (Signed)
BP Readings from Last 3 Encounters:  10/09/18 (!) 142/80  10/06/18 130/70  05/17/18 120/82   Overall controlled Continue current medication cmp

## 2018-10-09 NOTE — Assessment & Plan Note (Signed)
dexa up to date  - done by gyn Walking for exercise Taking cal/vit d

## 2018-12-16 DIAGNOSIS — H6503 Acute serous otitis media, bilateral: Secondary | ICD-10-CM | POA: Diagnosis not present

## 2018-12-16 DIAGNOSIS — J209 Acute bronchitis, unspecified: Secondary | ICD-10-CM | POA: Diagnosis not present

## 2018-12-22 ENCOUNTER — Ambulatory Visit: Payer: Self-pay

## 2018-12-22 NOTE — Telephone Encounter (Signed)
Pt. Reports she went to urgent care last weekend and was started on an antibiotic and her ears were cleaned. Left ear is still "congested." Feels like when she has blown her nose, maybe she injured her ear. No fever or sinus congestion now.Will complete antibiotic today.Appointment for tomorrow.  Reason for Disposition . Earache persists > 1 hour  Answer Assessment - Initial Assessment Questions 1. LOCATION: "Which ear is involved?"       Left ear 2. SENSATION: "Describe how the ear feels."      Feels stopped up  3. ONSET:  "When did the ear symptoms start?"       More than a week ago 4. PAIN: "Do you also have an earache?" If so, ask: "How bad is it?" (Scale 1-10; or mild, moderate, severe)     No pain 5. CAUSE: "What do you think is causing the ear congestion?"     Unsure 6. URI: "Do you have a runny nose or cough?"      No 7. NASAL ALLERGIES: "Are there symptoms of hay fever, such as sneezing or a clear nasal discharge?"     No 8. PREGNANCY: "Is there any chance you are pregnant?" "When was your last menstrual period?"     No  Protocols used: EAR - CONGESTION-A-AH

## 2018-12-23 ENCOUNTER — Other Ambulatory Visit: Payer: Self-pay | Admitting: Internal Medicine

## 2018-12-23 ENCOUNTER — Encounter: Payer: Self-pay | Admitting: Internal Medicine

## 2018-12-23 ENCOUNTER — Ambulatory Visit (INDEPENDENT_AMBULATORY_CARE_PROVIDER_SITE_OTHER): Payer: Medicare Other | Admitting: Internal Medicine

## 2018-12-23 VITALS — BP 124/76 | HR 70 | Temp 98.1°F | Wt 189.0 lb

## 2018-12-23 DIAGNOSIS — H6980 Other specified disorders of Eustachian tube, unspecified ear: Secondary | ICD-10-CM

## 2018-12-23 MED ORDER — AZELASTINE HCL 0.1 % NA SOLN: 2.0000 | Freq: Two times a day (BID) | NASAL | 6 refills | Status: DC

## 2018-12-23 MED ORDER — PREDNISONE 10 MG PO TABS: ORAL_TABLET | ORAL | 0 refills | Status: DC

## 2018-12-23 NOTE — Patient Instructions (Signed)
If  cough:  Take Mucinex DM twice a day as needed until better  For nasal congestion: Use OTC Nasocort   : 2 nasal sprays on each side of the nose daily  until you feel better Use ASTELIN a prescribed spray : 2 nasal sprays on each side of the nose twice a day until you feel better   Avoid decongestants such as  Pseudoephedrine or phenylephrine    Take prednisone ONLY if no better in 3-4 days   Call anytime if the symptoms are severe

## 2018-12-23 NOTE — Progress Notes (Signed)
Subjective:    Patient ID: Abigail Long, female    DOB: 1941/04/21, 78 y.o.   MRN: [id number]  DOS:  12/23/2018 Type of visit - description: Acute, Saturday clinic Symptoms started before Christmas with a URI , cough and green sputum production. Since then she is gradually better except for a feeling of pressure @ ears bilaterally, worse on the left. Went to urgent care a week ago, was Rx doxycycline which she already finished and Tessalon that she is not needing at this time.  She is here because the feeling of pressure on the ears continue  .Review of Systems  Currently with no fever, chills.  No cough, sore throat or nasal discharge Past Medical History:  Diagnosis Date  . Hypertension     Past Surgical History:  Procedure Laterality Date  . COLONOSCOPY  2008    Negative,Dr Henrene Pastor  . DILATION AND CURETTAGE OF UTERUS    . TONSILLECTOMY    . TOOTH EXTRACTION     right side of mouth and implant placed     Social History   Socioeconomic History  . Marital status: Married    Spouse name: Not on file  . Number of children: Not on file  . Years of education: Not on file  . Highest education level: Not on file  Occupational History  . Not on file  Social Needs  . Financial resource strain: Not on file  . Food insecurity:    Worry: Not on file    Inability: Not on file  . Transportation needs:    Medical: Not on file    Non-medical: Not on file  Tobacco Use  . Smoking status: Never Smoker  . Smokeless tobacco: Never Used  Substance and Sexual Activity  . Alcohol use: No  . Drug use: No  . Sexual activity: Yes    Partners: Male    Comment: 1st intercourse- 52, partners- 2, married- 29 yrs   Lifestyle  . Physical activity:    Days per week: Not on file    Minutes per session: Not on file  . Stress: Not on file  Relationships  . Social connections:    Talks on phone: Not on file    Gets together: Not on file    Attends religious service: Not on file   Active member of club or organization: Not on file    Attends meetings of clubs or organizations: Not on file    Relationship status: Not on file  . Intimate partner violence:    Fear of current or ex partner: Not on file    Emotionally abused: Not on file    Physically abused: Not on file    Forced sexual activity: Not on file  Other Topics Concern  . Not on file  Social History Narrative   Ex[facility]      Allergies as of 12/23/2018   No Known Allergies     Medication List       Accurate as of December 23, 2018 10:32 AM. Always use your most recent med list.        amLODipine 5 MG tablet Commonly known as:  NORVASC Take 1 tablet (5 mg total) by mouth daily.   azelastine 0.1 % nasal spray Commonly known as:  ASTELIN Place 2 sprays into both nostrils 2 (two) times daily.   CALCIUM 1200 PO Take by mouth daily.   CENTRUM SILVER PO Take by mouth  daily.   cholecalciferol 1000 units tablet Commonly known as:  VITAMIN D Take 1,000 Units by mouth daily.   GLUCOSAMINE 1500 COMPLEX PO Take by mouth daily. Take 2-3 pills daily   predniSONE 10 MG tablet Commonly known as:  DELTASONE 2 tabs a day x 5 days   vitamin C 1000 MG tablet Take 1,000 mg by mouth daily.           Objective:   Physical Exam BP 124/76   Pulse 70   Temp 98.1 F (36.7 C) (Oral)   Wt 189 lb (85.7 kg)   SpO2 97%   BMI 33.48 kg/m  General:   Well developed, NAD, BMI noted. HEENT:  Normocephalic . Face symmetric, atraumatic.  Nose is slightly congested.  Sinuses no TTP Right ear: Normal canal, TM essentially normal. Left ear: Normal canal, TM slightly bulge, not red. Lungs:  CTA B Normal respiratory effort, no intercostal retractions, no accessory muscle use. Heart: RRR,  no murmur.  No pretibial edema bilaterally  Skin: Not pale. Not jaundice Neurologic:  alert & oriented X3.  Speech normal, gait appropriate for age and unassisted Psych--  Cognition  and judgment appear intact.  Cooperative with normal attention span and concentration.  Behavior appropriate. No anxious or depressed appearing.      Assessment     78 year old female, history of hypertension, presents with  Eustachian tube dysfunction: Ear pressure likely due to ETD, no evidence of active infection. Recommend no more antibiotics, continue using Nasacort daily Add Astelin If not better will use the prednisone prescription I sent. If symptoms persist recommend to call the office.

## 2019-01-31 ENCOUNTER — Ambulatory Visit: Payer: Self-pay

## 2019-01-31 ENCOUNTER — Ambulatory Visit (INDEPENDENT_AMBULATORY_CARE_PROVIDER_SITE_OTHER): Payer: Medicare Other | Admitting: Internal Medicine

## 2019-01-31 ENCOUNTER — Encounter: Payer: Self-pay | Admitting: Internal Medicine

## 2019-01-31 VITALS — BP 122/74 | HR 64 | Temp 98.4°F | Resp 16 | Ht 63.0 in | Wt 191.0 lb

## 2019-01-31 DIAGNOSIS — R42 Dizziness and giddiness: Secondary | ICD-10-CM | POA: Insufficient documentation

## 2019-01-31 MED ORDER — MECLIZINE HCL 25 MG PO TABS: 25.0000 mg | ORAL_TABLET | Freq: Three times a day (TID) | ORAL | 0 refills | Status: DC | PRN

## 2019-01-31 NOTE — Telephone Encounter (Signed)
Incoming  Call from  Patient,  With  A  Complaint of of  Feeling  Of being  Dizzy.  With  An  Onset of  4:15 am.  Had to get  Help  To Rest  Room. Patient  Also  Reports  Feeling  Nauseated today.  States that  Her  Head is  Going  Left  To  Right, and room  Is  Moving  Right to  Left.  Rates  It  Severe.  Holding  Her  Head  Down  Aggravates it.    Blood  Pressure is 170/80  Hr  61.   Denies  Chest pain, vomiting, diarrhea.  Appointment  Scheduled for  Today  01/31/19  @3 :00pm with  Dr.  Billey Gosling.  Patient  Voices  Understanding.                                               i Reason for Disposition . [1] MILD dizziness (e.g., walking normally) AND [2] has NOT been evaluated by physician for this  (Exception: dizziness caused by heat exposure, sudden standing, or poor fluid intake)  Answer Assessment - Initial Assessment Questions 1. DESCRIPTION: "Describe your dizziness."     *No Answer* 2. LIGHTHEADED: "Do you feel lightheaded?" (e.g., somewhat faint, woozy, weak upon standing)      3. VERTIGO: "Do you feel like either you or the room is spinning or tilting?" (i.e. vertigo)     Yes  hesd  Feels  Like  It  Going  Left to  Right.  4. SEVERITY: "How bad is it?"  "Do you feel like you are going to faint?" "Can you stand and walk?"   - MILD - walking normally   - MODERATE - interferes with normal activities (e.g., work, school)    - SEVERE - unable to stand, requires support to walk, feels like passing out now.      severe 5. ONSET:  "When did the dizziness begin?"     4:14  Last  Night.6. AGGRAVATING FACTORS: "Does anything make it worse?" (e.g., standing, change in head position)     Leaning to right  Or left.  Holding  Head  down 7. HEART RATE: "Can you tell me your heart rate?" "How many beats in 15 seconds?"  (Note: not all patients can do this)        8. CAUSE: "What do you think is causing the dizziness?"     *No Answer* 9. RECURRENT SYMPTOM: "Have you had dizziness before?" If so, ask:  "When was the last time?" "What happened that time?"     *No Answer* 10. OTHER SYMPTOMS: "Do you have any other symptoms?" (e.g., fever, chest pain, vomiting, diarrhea, bleeding)       A  neaseated 11. PREGNANCY: "Is there any chance you are pregnant?" "When was your last menstrual period?"       *No Answer*  Protocols used: DIZZINESS Heidi Dach

## 2019-01-31 NOTE — Telephone Encounter (Signed)
Seeing you at 3:15.  

## 2019-01-31 NOTE — Progress Notes (Signed)
Subjective:    Patient ID: Abigail Long, female    DOB: 10-30-1941, 78 y.o.   MRN: [id number]  HPI The patient is here for an acute visit.   Dizziness:  She turned over in bed at 4:15 and she had a spinning sensation inside her head.  She got out of bed at 6:30 am and did not feel it. At 10:30 she bent over to comb her hair and she had another episode of spinning sensation.  She held onto the sink and went down on her knees.  That episode lasted 3-5 minutes.  She was sitting in her recliner at 11:30 and she was not moving and she had the spinning sensation.  She felt a little weak at that time.  She has had intermittent nausea.    She took meclizine 25 mg once and it has helped.  Her mother had dizziness and she took meclizine so she took it earlier today and has not had any spinning feeling since then.  Currently she feels fine, but is concerned that the vertigo will come back.  She feels just a touch of nausea now.   She denies a previous history of vertigo.  She denies any cold symptoms.  She denies headaches, numbness, tingling and weakness in her arms and legs.  She denies any chest pain, palpitations and shortness of breath.  She denies any changes in vision.   Medications and allergies reviewed with patient and updated if appropriate.  Patient Active Problem List   Diagnosis Date Noted  . Prediabetes 10/08/2018  . Obese 08/25/2016  . Meniscus tear 12/18/2015  . Arthritis of right lower extremity 10/08/2015  . HTN (hypertension) 05/23/2013  . Osteopenia 07/09/2010  . Hyperlipidemia 06/19/2009  . NONSPECIFIC ABNORMAL ELECTROCARDIOGRAM 06/19/2009    Current Outpatient Medications on File Prior to Visit  Medication Sig Dispense Refill  . amLODipine (NORVASC) 5 MG tablet TAKE 1 TABLET BY MOUTH EVERY DAY 90 tablet 2  . Ascorbic Acid (VITAMIN C) 1000 MG tablet Take 1,000 mg by mouth daily.    Marland [employer] azelastine (ASTELIN) 0.1 % nasal spray Place 2 sprays into both nostrils 2 (two)  times daily. 30 mL 6  . Calcium Carbonate-Vit D-Min (CALCIUM 1200 PO) Take by mouth daily.    . cholecalciferol (VITAMIN D) 1000 units tablet Take 1,000 Units by mouth daily.    . Glucosamine-Chondroit-Vit C-Mn (GLUCOSAMINE 1500 COMPLEX PO) Take by mouth daily. Take 2-3 pills daily    . Multiple Vitamins-Minerals (CENTRUM SILVER PO) Take by mouth daily.       No current facility-administered medications on file prior to visit.     Past Medical History:  Diagnosis Date  . Hypertension     Past Surgical History:  Procedure Laterality Date  . COLONOSCOPY  2008    Negative,Dr Henrene Pastor  . DILATION AND CURETTAGE OF UTERUS    . TONSILLECTOMY    . TOOTH EXTRACTION     right side of mouth and implant placed     Social History   Socioeconomic History  . Marital status: Married    Spouse name: Not on file  . Number of children: Not on file  . Years of education: Not on file  . Highest education level: Not on file  Occupational History  . Not on file  Social Needs  . Financial resource strain: Not on file  . Food insecurity:    Worry: Not on file    Inability: Not on file  .  Transportation needs:    Medical: Not on file    Non-medical: Not on file  Tobacco Use  . Smoking status: Never Smoker  . Smokeless tobacco: Never Used  Substance and Sexual Activity  . Alcohol use: No  . Drug use: No  . Sexual activity: Yes    Partners: Male    Comment: 1st intercourse- 60, partners- 2, married- 2 yrs   Lifestyle  . Physical activity:    Days per week: Not on file    Minutes per session: Not on file  . Stress: Not on file  Relationships  . Social connections:    Talks on phone: Not on file    Gets together: Not on file    Attends religious service: Not on file    Active member of club or organization: Not on file    Attends meetings of clubs or organizations: Not on file    Relationship status: Not on file  Other Topics Concern  . Not on file  Social History Narrative    Exercise:  [facility]    Family History  Problem Relation Age of Onset  . Heart disease Father 63       CBAG X 3; pacer  . Hypertension Mother   . Cancer Maternal Grandfather        renal  . Cancer Paternal Grandfather        intra- abdominal  . Stroke Maternal Aunt 85  . Breast cancer Maternal Aunt   . Bipolar disorder Son   . Diabetes Neg Hx   . Hyperlipidemia Neg Hx     Review of Systems  Constitutional: Negative for chills and fever.  HENT: Negative for congestion, ear pain, sinus pain and sore throat.   Eyes: Negative for visual disturbance.  Respiratory: Negative for cough, shortness of breath and wheezing.   Cardiovascular: Negative for chest pain, palpitations and leg swelling.  Gastrointestinal: Positive for nausea.  Neurological: Positive for dizziness. Negative for weakness, numbness and headaches.       Objective:   Vitals:   01/31/19 1502  BP: 122/74  Pulse: 64  Resp: 16  Temp: 98.4 F (36.9 C)  SpO2: 94%   BP Readings from Last 3 Encounters:  01/31/19 122/74  12/23/18 124/76  10/09/18 (!) 142/80   Wt Readings from Last 3 Encounters:  01/31/19 191 lb (86.6 kg)  12/23/18 189 lb (85.7 kg)  10/09/18 196 lb (88.9 kg)   Body mass index is 33.83 kg/m.   Physical Exam Constitutional:      General: She is not in acute distress.    Appearance: Normal appearance. She is not ill-appearing.  HENT:     Head: Normocephalic and atraumatic.     Right Ear: Tympanic membrane, ear canal and external ear normal.     Left Ear: Tympanic membrane, ear canal and external ear normal.     Nose: Nose normal.     Mouth/Throat:     Mouth: Mucous membranes are moist.     Pharynx: No posterior oropharyngeal erythema.  Eyes:     Extraocular Movements: Extraocular movements intact.     Conjunctiva/sclera: Conjunctivae normal.  Neck:     Musculoskeletal: Normal range of motion. No muscular tenderness.  Cardiovascular:     Rate and Rhythm:  Regular rhythm.     Heart sounds: No murmur.  Pulmonary:     Effort: Pulmonary effort is normal. No respiratory distress.     Breath sounds: Normal  breath sounds. No wheezing or rhonchi.  Musculoskeletal:     Right lower leg: No edema.     Left lower leg: No edema.  Skin:    General: Skin is warm and dry.  Neurological:     Mental Status: She is alert.     Cranial Nerves: No cranial nerve deficit.     Sensory: No sensory deficit.     Motor: No weakness.           Assessment & Plan:    See Problem List for Assessment and Plan of chronic medical problems.

## 2019-01-31 NOTE — Assessment & Plan Note (Signed)
Likely BPPV Started this morning and improved after taking meclizine today No concerning neurological symptoms and exam non focal Exam is normal Meclizine 25 mg TID prn Call if no improvement - can consider PT

## 2019-01-31 NOTE — Patient Instructions (Addendum)
Take meclizine 25 mg three times a day as needed.  Keep hydrated.   Call if no improvement and we can consider PT    Vertigo Vertigo is the feeling that you or your surroundings are moving when they are not. Vertigo can be dangerous if it occurs while you are doing something that could endanger you or others, such as driving. What are the causes? This condition is caused by a disturbance in the signals that are sent by your body's sensory systems to your brain. Different causes of a disturbance can lead to vertigo, including:  Infections, especially in the inner ear.  A bad reaction to a drug, or misuse of alcohol and medicines.  Withdrawal from drugs or alcohol.  Quickly changing positions, as when lying down or rolling over in bed.  Migraine headaches.  Decreased blood flow to the brain.  Decreased blood pressure.  Increased pressure in the brain from a head or neck injury, stroke, infection, tumor, or bleeding.  Central nervous system disorders. What are the signs or symptoms? Symptoms of this condition usually occur when you move your head or your eyes in different directions. Symptoms may start suddenly, and they usually last for less than a minute. Symptoms may include:  Loss of balance and falling.  Feeling like you are spinning or moving.  Feeling like your surroundings are spinning or moving.  Nausea and vomiting.  Blurred vision or double vision.  Difficulty hearing.  Slurred speech.  Dizziness.  Involuntary eye movement (nystagmus). Symptoms can be mild and cause only slight annoyance, or they can be severe and interfere with daily life. Episodes of vertigo may return (recur) over time, and they are often triggered by certain movements. Symptoms may improve over time. How is this diagnosed? This condition may be diagnosed based on medical history and the quality of your nystagmus. Your health care provider may test your eye movements by asking you to  quickly change positions to trigger the nystagmus. This may be called the Dix-Hallpike test, head thrust test, or roll test. You may be referred to a health care provider who specializes in ear, nose, and throat (ENT) problems (otolaryngologist) or a provider who specializes in disorders of the central nervous system (neurologist). You may have additional testing, including:  A physical exam.  Blood tests.  MRI.  A CT scan.  An electrocardiogram (ECG). This records electrical activity in your heart.  An electroencephalogram (EEG). This records electrical activity in your brain.  Hearing tests. How is this treated? Treatment for this condition depends on the cause and the severity of the symptoms. Treatment options include:  Medicines to treat nausea or vertigo. These are usually used for severe cases. Some medicines that are used to treat other conditions may also reduce or eliminate vertigo symptoms. These include: ? Medicines that control allergies (antihistamines). ? Medicines that control seizures (anticonvulsants). ? Medicines that relieve depression (antidepressants). ? Medicines that relieve anxiety (sedatives).  Head movements to adjust your inner ear back to normal. If your vertigo is caused by an ear problem, your health care provider may recommend certain movements to correct the problem.  Surgery. This is rare. Follow these instructions at home: Safety  Move slowly.Avoid sudden body or head movements.  Avoid driving.  Avoid operating heavy machinery.  Avoid doing any tasks that would cause danger to you or others if you would have a vertigo episode during the task.  If you have trouble walking or keeping your balance, try using a  cane for stability. If you feel dizzy or unstable, sit down right away.  Return to your normal activities as told by your health care provider. Ask your health care provider what activities are safe for you. General instructions  Take  over-the-counter and prescription medicines only as told by your health care provider.  Avoid certain positions or movements as told by your health care provider.  Drink enough fluid to keep your urine clear or pale yellow.  Keep all follow-up visits as told by your health care provider. This is important. Contact a health care provider if:  Your medicines do not relieve your vertigo or they make it worse.  You have a fever.  Your condition gets worse or you develop new symptoms.  Your family or friends notice any behavioral changes.  Your nausea or vomiting gets worse.  You have numbness or a "pins and needles" sensation in part of your body. Get help right away if:  You have difficulty moving or speaking.  You are always dizzy.  You faint.  You develop severe headaches.  You have weakness in your hands, arms, or legs.  You have changes in your hearing or vision.  You develop a stiff neck.  You develop sensitivity to light. This information is not intended to replace advice given to you by your health care provider. Make sure you discuss any questions you have with your health care provider. Document Released: 09/15/2005 Document Revised: 05/19/2016 Document Reviewed: 03/31/2015 Elsevier Interactive Patient Education  2019 [employer].

## 2019-04-13 ENCOUNTER — Encounter: Payer: Self-pay | Admitting: Internal Medicine

## 2019-06-28 DIAGNOSIS — H2513 Age-related nuclear cataract, bilateral: Secondary | ICD-10-CM | POA: Diagnosis not present

## 2019-09-18 ENCOUNTER — Other Ambulatory Visit: Payer: Self-pay | Admitting: Obstetrics & Gynecology

## 2019-09-18 DIAGNOSIS — Z1231 Encounter for screening mammogram for malignant neoplasm of breast: Secondary | ICD-10-CM

## 2019-10-10 ENCOUNTER — Other Ambulatory Visit: Payer: Self-pay

## 2019-10-11 ENCOUNTER — Ambulatory Visit (INDEPENDENT_AMBULATORY_CARE_PROVIDER_SITE_OTHER): Payer: Medicare Other | Admitting: Obstetrics & Gynecology

## 2019-10-11 ENCOUNTER — Encounter: Payer: Self-pay | Admitting: Obstetrics & Gynecology

## 2019-10-11 VITALS — BP 140/88 | Ht 63.0 in | Wt 177.0 lb

## 2019-10-11 DIAGNOSIS — E6609 Other obesity due to excess calories: Secondary | ICD-10-CM | POA: Diagnosis not present

## 2019-10-11 DIAGNOSIS — M8589 Other specified disorders of bone density and structure, multiple sites: Secondary | ICD-10-CM

## 2019-10-11 DIAGNOSIS — Z23 Encounter for immunization: Secondary | ICD-10-CM | POA: Diagnosis not present

## 2019-10-11 DIAGNOSIS — Z6831 Body mass index (BMI) 31.0-31.9, adult: Secondary | ICD-10-CM

## 2019-10-11 DIAGNOSIS — Z780 Asymptomatic menopausal state: Secondary | ICD-10-CM

## 2019-10-11 DIAGNOSIS — Z01419 Encounter for gynecological examination (general) (routine) without abnormal findings: Secondary | ICD-10-CM

## 2019-10-11 DIAGNOSIS — E66811 Obesity, class 1: Secondary | ICD-10-CM

## 2019-10-11 NOTE — Progress Notes (Signed)
Abigail Long 1941-06-20 [id number]   History:    78 y.o. G2P2L2 Married  RP:  Established patient presenting for annual gyn exam   HPI: Menopause, well on no hormone replacement therapy.  No postmenopausal bleeding.  No pelvic pain.  No pain with intercourse, using [place] successfully.  Urine and bowel movements normal.  Breasts normal.  Body mass index much improved at 31.35 this year with [employer] and increased physical activities.  Health labs with family physician.  Past medical history,surgical history, family history and social history were all reviewed and documented in the EPIC chart.  Gynecologic History No LMP recorded. Patient is postmenopausal. Contraception: post menopausal status Last Pap: 09/2017. Results were: Negative Last mammogram: 09/2018. Results were: Negative.  Scheduled for tomorrow Bone Density: 09/2017 Osteopenia -1.8 at Rt Femoral Neck.  [facility] Colonoscopy: 2012  Obstetric History OB History  Gravida Para Term Preterm AB Living  2 2       2   SAB TAB Ectopic Multiple Live Births               # Outcome Date GA Lbr Len/2nd Weight Sex Delivery Anes PTL Lv  2 Para           1 Para              ROS: A ROS was performed and pertinent positives and negatives are included in the history.  GENERAL: No fevers or chills. HEENT: No change in vision, no earache, sore throat or sinus congestion. NECK: No pain or stiffness. CARDIOVASCULAR: No chest pain or pressure. No palpitations. PULMONARY: No shortness of breath, cough or wheeze. GASTROINTESTINAL: No abdominal pain, nausea, vomiting or diarrhea, melena or bright red blood per rectum. GENITOURINARY: No urinary frequency, urgency, hesitancy or dysuria. MUSCULOSKELETAL: No joint or muscle pain, no back pain, no recent trauma. DERMATOLOGIC: No rash, no itching, no lesions. ENDOCRINE: No polyuria, polydipsia, no heat or cold intolerance. No recent change in weight. HEMATOLOGICAL:  No anemia or easy bruising or bleeding. NEUROLOGIC: No headache, seizures, numbness, tingling or weakness. PSYCHIATRIC: No depression, no loss of interest in normal activity or change in sleep pattern.     Exam:   BP 140/88   Ht 5\' 3"  (1.6 m)   Wt 177 lb (80.3 kg)   BMI 31.35 kg/m   Body mass index is 31.35 kg/m.  General appearance : Well developed well nourished female. No acute distress HEENT: Eyes: no retinal hemorrhage or exudates,  Neck supple, trachea midline, no carotid bruits, no thyroidmegaly Lungs: Clear to auscultation, no rhonchi or wheezes, or rib retractions  Heart: Regular rate and rhythm, no murmurs or gallops Breast:Examined in sitting and supine position were symmetrical in appearance, no palpable masses or tenderness,  no skin retraction, no nipple inversion, no nipple discharge, no skin discoloration, no axillary or supraclavicular lymphadenopathy Abdomen: no palpable masses or tenderness, no rebound or guarding Extremities: no edema or skin discoloration or tenderness  Pelvic: Vulva: Normal             Vagina: No gross lesions or discharge  Cervix: No gross lesions or discharge  Uterus  AV, normal size, shape and consistency, non-tender and mobile  Adnexa  Without masses or tenderness  Anus: Normal   Assessment/Plan:  78 y.o. female for annual exam   1. Well female exam with routine gynecological exam Normal gynecologic exam in menopause.  Last Pap test October 2019 was negative, no  indication to repeat this year.  Breast exam normal.  Schedule for repeat screening mammogram tomorrow.  Health labs with family physician.  2. Postmenopausal Well on no hormone replacement therapy.  No postmenopausal bleeding.  3. Osteopenia of multiple sites Schedule bone density now.  Vitamin D supplements, calcium intake of 1200 mg daily and regular weightbearing physical activity is recommended. - DG BONE DENSITY (DXA); Future  4. Class 1 obesity due to excess calories  without serious comorbidity with body mass index (BMI) of 31.0 to 31.9 in adult Recommend a low calorie/carb diet such as [employer].  Aerobic physical activities 5 times a week and weightlifting every 2 days.  5. Need for immunization against influenza - Flu Vaccine QUAD High Dose(Fluad)  Princess Bruins MD, 9:53 AM 10/11/2019

## 2019-10-12 ENCOUNTER — Ambulatory Visit
Admission: RE | Admit: 2019-10-12 | Discharge: 2019-10-12 | Disposition: A | Discharge status: Home / Self Care | Payer: Medicare Other | Source: Ambulatory Visit | Attending: Obstetrics & Gynecology | Admitting: Obstetrics & Gynecology

## 2019-10-12 ENCOUNTER — Other Ambulatory Visit: Payer: Self-pay

## 2019-10-12 ENCOUNTER — Encounter: Payer: Self-pay | Admitting: Obstetrics & Gynecology

## 2019-10-12 DIAGNOSIS — Z1231 Encounter for screening mammogram for malignant neoplasm of breast: Secondary | ICD-10-CM

## 2019-10-12 NOTE — Patient Instructions (Signed)
1. Well female exam with routine gynecological exam Normal gynecologic exam in menopause.  Last Pap test October 2019 was negative, no indication to repeat this year.  Breast exam normal.  Schedule for repeat screening mammogram tomorrow.  Health labs with family physician.  2. Postmenopausal Well on no hormone replacement therapy.  No postmenopausal bleeding.  3. Osteopenia of multiple sites Schedule bone density now.  Vitamin D supplements, calcium intake of 1200 mg daily and regular weightbearing physical activity is recommended. - DG BONE DENSITY (DXA); Future  4. Class 1 obesity due to excess calories without serious comorbidity with body mass index (BMI) of 31.0 to 31.9 in adult Recommend a low calorie/carb diet such as [employer].  Aerobic physical activities 5 times a week and weightlifting every 2 days.  5. Need for immunization against influenza - Flu Vaccine QUAD High Dose(Fluad)  Naa, it was a pleasure seeing you today!

## 2019-10-18 NOTE — Progress Notes (Signed)
Subjective:    Patient ID: Abigail Long, female    DOB: 1941/03/12, 78 y.o.   MRN: [id number]  HPI She is here for a physical exam.   In the morning she has to clear out her throat. The rest of the day she is ok.  She tried flonase but it did not help  This is year round.    She has no other concerns.  She has lost weight over the past year doing [employer].    Medications and allergies reviewed with patient and updated if appropriate.  Patient Active Problem List   Diagnosis Date Noted  . Dizziness 01/31/2019  . Prediabetes 10/08/2018  . Obese 08/25/2016  . Meniscus tear 12/18/2015  . Arthritis of right lower extremity 10/08/2015  . HTN (hypertension) 05/23/2013  . Osteopenia 07/09/2010  . Hyperlipidemia 06/19/2009  . NONSPECIFIC ABNORMAL ELECTROCARDIOGRAM 06/19/2009    Current Outpatient Medications on File Prior to Visit  Medication Sig Dispense Refill  . amLODipine (NORVASC) 5 MG tablet TAKE 1 TABLET BY MOUTH EVERY DAY 90 tablet 2  . Ascorbic Acid (VITAMIN C) 1000 MG tablet Take 1,000 mg by mouth daily.    Marland Kitchen azelastine (ASTELIN) 0.1 % nasal spray Place 2 sprays into both nostrils 2 (two) times daily. 30 mL 6  . Calcium Carbonate-Vit D-Min (CALCIUM 1200 PO) Take by mouth daily.    . cholecalciferol (VITAMIN D) 1000 units tablet Take 1,000 Units by mouth daily.    . Glucosamine-Chondroit-Vit C-Mn (GLUCOSAMINE 1500 COMPLEX PO) Take by mouth daily. Take 2-3 pills daily    . Multiple Vitamins-Minerals (CENTRUM SILVER PO) Take by mouth daily.       No current facility-administered medications on file prior to visit.     Past Medical History:  Diagnosis Date  . Hypertension     Past Surgical History:  Procedure Laterality Date  . COLONOSCOPY  2008    Negative,Dr Henrene Pastor  . DILATION AND CURETTAGE OF UTERUS    . TONSILLECTOMY    . TOOTH EXTRACTION     right side of mouth and implant placed     Social History   Socioeconomic History  . Marital status:  Married    Spouse name: Not on file  . Number of children: Not on file  . Years of education: Not on file  . Highest education level: Not on file  Occupational History  . Not on file  Social Needs  . Financial resource strain: Not on file  . Food insecurity    Worry: Not on file    Inability: Not on file  . Transportation needs    Medical: Not on file    Non-medical: Not on file  Tobacco Use  . Smoking status: Never Smoker  . Smokeless tobacco: Never Used  Substance and Sexual Activity  . Alcohol use: No  . Drug use: No  . Sexual activity: Yes    Partners: Male    Comment: 1st intercourse- 62, partners- 2, married- 88 yrs   Lifestyle  . Physical activity    Days per week: Not on file    Minutes per session: Not on file  . Stress: Not on file  Relationships  . Social Herbalist on phone: Not on file    Gets together: Not on file    Attends religious service: Not on file    Active member of club or organization: Not on file    Attends meetings of clubs or  organizations: Not on file    Relationship status: Not on file  Other Topics Concern  . Not on file  Social History Narrative   Ex[facility]    Family History  Problem Relation Age of Onset  . Heart disease Father 88       CBAG X 3; pacer  . Hypertension Mother   . Cancer Maternal Grandfather        renal  . Cancer Paternal Grandfather        intra- abdominal  . Stroke Maternal Aunt 85  . Breast cancer Maternal Aunt   . Bipolar disorder Son   . Diabetes Neg Hx   . Hyperlipidemia Neg Hx     Review of Systems  Constitutional: Negative for chills and fever.  HENT: Positive for postnasal drip.   Eyes: Negative for visual disturbance.  Respiratory: Positive for cough (in morning). Negative for shortness of breath and wheezing.   Cardiovascular: Positive for palpitations (occ, transient). Negative for chest pain and leg swelling.  Gastrointestinal: Negative for  abdominal pain, blood in stool, constipation, diarrhea and nausea.       No gerd  Genitourinary: Negative for dysuria and hematuria.  Musculoskeletal: Positive for arthralgias (foot and hand arthritis.). Negative for back pain.  Skin: Negative for color change and rash.       Two little bumps on right upper eyelid  Neurological: Positive for headaches. Negative for dizziness and light-headedness.  Psychiatric/Behavioral: Negative for dysphoric mood. The patient is not nervous/anxious.        Objective:   Vitals:   10/19/19 0756  BP: 132/64  Pulse: 65  Resp: 16  Temp: 98.2 F (36.8 C)  SpO2: 96%   Filed Weights   10/19/19 0756  Weight: 176 lb 12.8 oz (80.2 kg)   Body mass index is 31.32 kg/m.  BP Readings from Last 3 Encounters:  10/19/19 132/64  10/11/19 140/88  01/31/19 122/74    Wt Readings from Last 3 Encounters:  10/19/19 176 lb 12.8 oz (80.2 kg)  10/11/19 177 lb (80.3 kg)  01/31/19 191 lb (86.6 kg)     Physical Exam Constitutional: She appears well-developed and well-nourished. No distress.  HENT:  Head: Normocephalic and atraumatic.  Right Ear: External ear normal. Normal ear canal and TM Left Ear: External ear normal.  Normal ear canal and TM Mouth/Throat: Oropharynx is clear and moist.  Eyes: Conjunctivae and EOM are normal.  Neck: Neck supple. No tracheal deviation present. No thyromegaly present.  No carotid bruit  Cardiovascular: Normal rate, regular rhythm and normal heart sounds.   No murmur heard.  No edema. Pulmonary/Chest: Effort normal and breath sounds normal. No respiratory distress. She has no wheezes. She has no rales.  Breast: deferred   Abdominal: Soft. She exhibits no distension. There is no tenderness.  Lymphadenopathy: She has no cervical adenopathy.  Skin: Skin is warm and dry. She is not diaphoretic.  Psychiatric: She has a normal mood and affect. Her behavior is normal.        Assessment & Plan:   Physical exam: Screening  blood work    ordered Immunizations  prevnar today, others up to date Colonoscopy  N/a  - no longer needed due to age 65  Up to date  Gyn  Up to date  Dexa     scheduled Eye exams  Up to date  Exercise  walking Weight   Has lost weight - 20 lbs in  the past year ( [employer]) Substance abuse    none  See Problem List for Assessment and Plan of chronic medical problems.   FU in one month

## 2019-10-18 NOTE — Patient Instructions (Addendum)
Tests ordered today. Your results will be released to [facility] (or called to you) after review.  If any changes need to be made, you will be notified at that same time.  All other Health Maintenance issues reviewed.   All recommended immunizations and age-appropriate screenings are up-to-date or discussed.  Prevnar pneumonia immunization administered today.   Medications reviewed and updated.  Changes include : a nasal spray for the post nasal drip.   Your prescription(s) have been submitted to your pharmacy. Please take as directed and contact our office if you believe you are having problem(s) with the medication(s).   Please followup in 1 year   Health Maintenance, Female Adopting a healthy lifestyle and getting preventive care are important in promoting health and wellness. Ask your health care provider about:  The right schedule for you to have regular tests and exams.  Things you can do on your own to prevent diseases and keep yourself healthy. What should I know about diet, weight, and exercise? Eat a healthy diet   Eat a diet that includes plenty of vegetables, fruits, low-fat dairy products, and lean protein.  Do not eat a lot of foods that are high in solid fats, added sugars, or sodium. Maintain a healthy weight Body mass index (BMI) is used to identify weight problems. It estimates body fat based on height and weight. Your health care provider can help determine your BMI and help you achieve or maintain a healthy weight. Get regular exercise Get regular exercise. This is one of the most important things you can do for your health. Most adults should:  Exercise for at least 150 minutes each week. The exercise should increase your heart rate and make you sweat (moderate-intensity exercise).  Do strengthening exercises at least twice a week. This is in addition to the moderate-intensity exercise.  Spend less time sitting. Even light physical activity can be beneficial.  Watch cholesterol and blood lipids Have your blood tested for lipids and cholesterol at 76 years of age, then have this test every 5 years. Have your cholesterol levels checked more often if:  Your lipid or cholesterol levels are high.  You are older than 76 years of age.  You are at high risk for heart disease. What should I know about cancer screening? Depending on your health history and family history, you may need to have cancer screening at various ages. This may include screening for:  Breast cancer.  Cervical cancer.  Colorectal cancer.  Skin cancer.  Lung cancer. What should I know about heart disease, diabetes, and high blood pressure? Blood pressure and heart disease  High blood pressure causes heart disease and increases the risk of stroke. This is more likely to develop in people who have high blood pressure readings, are of African descent, or are overweight.  Have your blood pressure checked: ? Every 3-5 years if you are 4-47 years of age. ? Every year if you are 29 years old or older. Diabetes Have regular diabetes screenings. This checks your fasting blood sugar level. Have the screening done:  Once every three years after age 32 if you are at a normal weight and have a low risk for diabetes.  More often and at a younger age if you are overweight or have a high risk for diabetes. What should I know about preventing infection? Hepatitis B If you have a higher risk for hepatitis B, you should be screened for this virus. Talk with your health care provider to find  out if you are at risk for hepatitis B infection. Hepatitis C Testing is recommended for:  Everyone born from [birth year] through 1963.  Anyone with known risk factors for hepatitis C. Sexually transmitted infections (STIs)  Get screened for STIs, including gonorrhea and chlamydia, if: ? You are sexually active and are younger than 76 years of age. ? You are older than 76 years of age and your health  care provider tells you that you are at risk for this type of infection. ? Your sexual activity has changed since you were last screened, and you are at increased risk for chlamydia or gonorrhea. Ask your health care provider if you are at risk.  Ask your health care provider about whether you are at high risk for HIV. Your health care provider may recommend a prescription medicine to help prevent HIV infection. If you choose to take medicine to prevent HIV, you should first get tested for HIV. You should then be tested every 3 months for as long as you are taking the medicine. Pregnancy  If you are about to stop having your period (premenopausal) and you may become pregnant, seek counseling before you get pregnant.  Take 400 to 800 micrograms (mcg) of folic acid every day if you become pregnant.  Ask for birth control (contraception) if you want to prevent pregnancy. Osteoporosis and menopause Osteoporosis is a disease in which the bones lose minerals and strength with aging. This can result in bone fractures. If you are 57 years old or older, or if you are at risk for osteoporosis and fractures, ask your health care provider if you should:  Be screened for bone loss.  Take a calcium or vitamin D supplement to lower your risk of fractures.  Be given hormone replacement therapy (HRT) to treat symptoms of menopause. Follow these instructions at home: Lifestyle  Do not use any products that contain nicotine or tobacco, such as cigarettes, e-cigarettes, and chewing tobacco. If you need help quitting, ask your health care provider.  Do not use street drugs.  Do not share needles.  Ask your health care provider for help if you need support or information about quitting drugs. Alcohol use  Do not drink alcohol if: ? Your health care provider tells you not to drink. ? You are pregnant, may be pregnant, or are planning to become pregnant.  If you drink alcohol: ? Limit how much you use to  0-1 drink a day. ? Limit intake if you are breastfeeding.  Be aware of how much alcohol is in your drink. In the [place] one drink equals one 12 oz bottle of beer (355 mL), one 5 oz glass of wine (148 mL), or one 1 oz glass of hard liquor (44 mL). General instructions  Schedule regular health, dental, and eye exams.  Stay current with your vaccines.  Tell your health care provider if: ? You often feel depressed. ? You have ever been abused or do not feel safe at home. Summary  Adopting a healthy lifestyle and getting preventive care are important in promoting health and wellness.  Follow your health care provider's instructions about healthy diet, exercising, and getting tested or screened for diseases.  Follow your health care provider's instructions on monitoring your cholesterol and blood pressure. This information is not intended to replace advice given to you by your health care provider. Make sure you discuss any questions you have with your health care provider. Document Released: 06/21/2011 Document Revised: 11/29/2018 Document Reviewed: 11/29/2018 Elsevier  Patient Education  [employer].

## 2019-10-19 ENCOUNTER — Encounter: Payer: Self-pay | Admitting: Internal Medicine

## 2019-10-19 ENCOUNTER — Other Ambulatory Visit: Payer: Self-pay

## 2019-10-19 ENCOUNTER — Other Ambulatory Visit (INDEPENDENT_AMBULATORY_CARE_PROVIDER_SITE_OTHER): Payer: Medicare Other

## 2019-10-19 ENCOUNTER — Ambulatory Visit (INDEPENDENT_AMBULATORY_CARE_PROVIDER_SITE_OTHER): Payer: Medicare Other | Admitting: Internal Medicine

## 2019-10-19 VITALS — BP 132/64 | HR 65 | Temp 98.2°F | Resp 16 | Ht 63.0 in | Wt 176.8 lb

## 2019-10-19 DIAGNOSIS — Z6831 Body mass index (BMI) 31.0-31.9, adult: Secondary | ICD-10-CM

## 2019-10-19 DIAGNOSIS — Z23 Encounter for immunization: Secondary | ICD-10-CM

## 2019-10-19 DIAGNOSIS — I10 Essential (primary) hypertension: Secondary | ICD-10-CM

## 2019-10-19 DIAGNOSIS — E6609 Other obesity due to excess calories: Secondary | ICD-10-CM

## 2019-10-19 DIAGNOSIS — E782 Mixed hyperlipidemia: Secondary | ICD-10-CM | POA: Diagnosis not present

## 2019-10-19 DIAGNOSIS — R7303 Prediabetes: Secondary | ICD-10-CM | POA: Diagnosis not present

## 2019-10-19 DIAGNOSIS — M8589 Other specified disorders of bone density and structure, multiple sites: Secondary | ICD-10-CM | POA: Diagnosis not present

## 2019-10-19 DIAGNOSIS — Z0000 Encounter for general adult medical examination without abnormal findings: Secondary | ICD-10-CM | POA: Diagnosis not present

## 2019-10-19 LAB — COMPREHENSIVE METABOLIC PANEL
ALT: 23 U/L (ref 0–35)
AST: 21 U/L (ref 0–37)
Albumin: 4.4 g/dL (ref 3.5–5.2)
Alkaline Phosphatase: 57 U/L (ref 39–117)
BUN: 14 mg/dL (ref 6–23)
CO2: 29 mEq/L (ref 19–32)
Calcium: 9.4 mg/dL (ref 8.4–10.5)
Chloride: 103 mEq/L (ref 96–112)
Creatinine, Ser: 0.67 mg/dL (ref 0.40–1.20)
GFR: 85.43 mL/min (ref >60.00)
Glucose, Bld: 106 mg/dL — ABNORMAL HIGH (ref 70–99)
Potassium: 4.3 mEq/L (ref 3.5–5.1)
Sodium: 140 mEq/L (ref 135–145)
Total Bilirubin: 0.4 mg/dL (ref 0.2–1.2)
Total Protein: 7.1 g/dL (ref 6.0–8.3)

## 2019-10-19 LAB — CBC WITH DIFFERENTIAL/PLATELET
Basophils Absolute: 0 10*3/uL (ref 0.0–0.1)
Basophils Relative: 0.6 % (ref 0.0–3.0)
Eosinophils Absolute: 0.2 10*3/uL (ref 0.0–0.7)
Eosinophils Relative: 3.4 % (ref 0.0–5.0)
HCT: 44.4 % (ref 36.0–46.0)
Hemoglobin: 14.6 g/dL (ref 12.0–15.0)
Lymphocytes Relative: 24.5 % (ref 12.0–46.0)
Lymphs Abs: 1.3 10*3/uL (ref 0.7–4.0)
MCHC: 33 g/dL (ref 30.0–36.0)
MCV: 84.7 fl (ref 78.0–100.0)
Monocytes Absolute: 0.6 10*3/uL (ref 0.1–1.0)
Monocytes Relative: 10.7 % (ref 3.0–12.0)
Neutro Abs: 3.4 10*3/uL (ref 1.4–7.7)
Neutrophils Relative %: 60.8 % (ref 43.0–77.0)
Platelets: 298 10*3/uL (ref 150.0–400.0)
RBC: 5.24 Mil/uL — ABNORMAL HIGH (ref 3.87–5.11)
RDW: 13.8 % (ref 11.5–15.5)
WBC: 5.5 10*3/uL (ref 4.0–10.5)

## 2019-10-19 LAB — LIPID PANEL
Cholesterol: 190 mg/dL (ref 0–200)
HDL: 58.3 mg/dL (ref >39.00)
LDL Cholesterol: 112 mg/dL — ABNORMAL HIGH (ref 0–99)
NonHDL: 131.39
Total CHOL/HDL Ratio: 3
Triglycerides: 96 mg/dL (ref 0.0–149.0)
VLDL: 19.2 mg/dL (ref 0.0–40.0)

## 2019-10-19 LAB — TSH: TSH: 2.92 u[IU]/mL (ref 0.35–4.50)

## 2019-10-19 LAB — HEMOGLOBIN A1C: Hgb A1c MFr Bld: 5.8 % (ref 4.6–6.5)

## 2019-10-19 MED ORDER — IPRATROPIUM BROMIDE 0.03 % NA SOLN: 2.0000 | Freq: Two times a day (BID) | NASAL | 12 refills | Status: DC

## 2019-10-19 NOTE — Assessment & Plan Note (Signed)
Check lipid panel, cmp, tsh  Lifestyle controlled Regular exercise and healthy diet encouraged  

## 2019-10-19 NOTE — Assessment & Plan Note (Signed)
BP well controlled Current regimen effective and well tolerated Continue current medications at current doses Cmp, cbc 

## 2019-10-19 NOTE — Assessment & Plan Note (Signed)
dexa up to date Walking Taking vitamin d and calcium Cbc, tsh, cmp

## 2019-10-19 NOTE — Addendum Note (Signed)
Addended by: Delice Bison E on: 10/19/2019 08:58 AM   Modules accepted: Orders

## 2019-10-19 NOTE — Assessment & Plan Note (Signed)
Check a1c Low sugar / carb diet Stressed regular exercise   

## 2019-10-19 NOTE — Assessment & Plan Note (Signed)
Has lost about 20 lbs with walking and [employer] Has gained a few pounds back Will continue efforts - goal around 160

## 2019-10-20 ENCOUNTER — Encounter: Payer: Self-pay | Admitting: Internal Medicine

## 2019-10-24 DIAGNOSIS — D23121 Other benign neoplasm of skin of left upper eyelid, including canthus: Secondary | ICD-10-CM | POA: Diagnosis not present

## 2019-10-30 ENCOUNTER — Other Ambulatory Visit: Payer: Self-pay | Admitting: Internal Medicine

## 2019-11-05 DIAGNOSIS — D23111 Other benign neoplasm of skin of right upper eyelid, including canthus: Secondary | ICD-10-CM | POA: Diagnosis not present

## 2019-12-10 ENCOUNTER — Other Ambulatory Visit: Payer: Self-pay

## 2019-12-10 ENCOUNTER — Ambulatory Visit
Admission: RE | Admit: 2019-12-10 | Discharge: 2019-12-10 | Disposition: A | Discharge status: Home / Self Care | Payer: Medicare Other | Source: Ambulatory Visit | Attending: Obstetrics & Gynecology | Admitting: Obstetrics & Gynecology

## 2019-12-10 DIAGNOSIS — M85852 Other specified disorders of bone density and structure, left thigh: Secondary | ICD-10-CM | POA: Diagnosis not present

## 2019-12-10 DIAGNOSIS — M8589 Other specified disorders of bone density and structure, multiple sites: Secondary | ICD-10-CM

## 2019-12-10 DIAGNOSIS — Z780 Asymptomatic menopausal state: Secondary | ICD-10-CM | POA: Diagnosis not present

## 2019-12-24 ENCOUNTER — Encounter: Payer: Self-pay | Admitting: Internal Medicine

## 2019-12-25 ENCOUNTER — Encounter: Payer: Self-pay | Admitting: Internal Medicine

## 2020-05-27 ENCOUNTER — Telehealth: Payer: Self-pay

## 2020-05-27 NOTE — Telephone Encounter (Signed)
Patient calling wanting to know when her last tetanus shot was. Advised of last Td injection (07/09/2010). Patient states that she had picked up her telephone and a little piece of metal went into her finger. States that she had pulled her phone out of her pocketbook. States that she was able to pull out the piece of metal and doesn't see anything else in there. Advised for an appointment since last OV was 10/19/2019. Denied appointment and would like to know what Dr Quay Burow would like for her to do.

## 2020-05-27 NOTE — Telephone Encounter (Signed)
Can do nurse visit for tdap for finger injury/cut

## 2020-05-27 NOTE — Telephone Encounter (Signed)
Patient scheduled to come tomorrow 05/27/2020 for tdap

## 2020-05-28 ENCOUNTER — Ambulatory Visit (INDEPENDENT_AMBULATORY_CARE_PROVIDER_SITE_OTHER): Payer: Medicare Other | Admitting: *Deleted

## 2020-05-28 ENCOUNTER — Other Ambulatory Visit: Payer: Self-pay

## 2020-05-28 DIAGNOSIS — Z23 Encounter for immunization: Secondary | ICD-10-CM | POA: Diagnosis not present

## 2020-07-01 DIAGNOSIS — H52203 Unspecified astigmatism, bilateral: Secondary | ICD-10-CM | POA: Diagnosis not present

## 2020-07-01 DIAGNOSIS — H2513 Age-related nuclear cataract, bilateral: Secondary | ICD-10-CM | POA: Diagnosis not present

## 2020-07-01 DIAGNOSIS — H5203 Hypermetropia, bilateral: Secondary | ICD-10-CM | POA: Diagnosis not present

## 2020-07-10 ENCOUNTER — Telehealth: Payer: Self-pay | Admitting: *Deleted

## 2020-07-10 NOTE — Telephone Encounter (Addendum)
Patient called requesting a call back to discuss recent weight gain, patient report she did start the [employer] and stopped, states she was tired of eating the same foods. Reports while on the diet she weighted at 169, today she weighted at 190. She walks when able, (when its not too hot outside) she asked if you have any other recommendations?

## 2020-07-10 NOTE — Telephone Encounter (Signed)
Schedule a visit with me for Weight Loss management.

## 2020-07-14 NOTE — Telephone Encounter (Signed)
Patient informed with below note. 

## 2020-07-30 ENCOUNTER — Other Ambulatory Visit: Payer: Self-pay

## 2020-07-30 ENCOUNTER — Ambulatory Visit: Payer: Medicare Other | Admitting: Obstetrics & Gynecology

## 2020-07-30 ENCOUNTER — Encounter: Payer: Self-pay | Admitting: Obstetrics & Gynecology

## 2020-07-30 VITALS — BP 128/84 | Ht 63.0 in | Wt 197.0 lb

## 2020-07-30 DIAGNOSIS — Z6834 Body mass index (BMI) 34.0-34.9, adult: Secondary | ICD-10-CM | POA: Diagnosis not present

## 2020-07-30 DIAGNOSIS — E6609 Other obesity due to excess calories: Secondary | ICD-10-CM | POA: Diagnosis not present

## 2020-07-30 DIAGNOSIS — Z713 Dietary counseling and surveillance: Secondary | ICD-10-CM | POA: Diagnosis not present

## 2020-07-30 NOTE — Progress Notes (Signed)
    Abigail Long [birth year] [id number]        79 y.o.  G2P2L2  RP: Weight loss counseling  HPI: Difficulty loosing weight.  Attempted [employer] with successful weight loss for a while, but then lost motivation.  Also needs to be more physically active.   OB History  Gravida Para Term Preterm AB Living  2 2       2   SAB TAB Ectopic Multiple Live Births               # Outcome Date GA Lbr Len/2nd Weight Sex Delivery Anes PTL Lv  2 Para           1 Para             Past medical history,surgical history, problem list, medications, allergies, family history and social history were all reviewed and documented in the EPIC chart.   Directed ROS with pertinent positives and negatives documented in the history of present illness/assessment and plan.  Exam:  Vitals:   07/30/20 1447  BP: 128/84  Weight: 197 lb (89.4 kg)  Height: 5\' 3"  (1.6 m)   General appearance:  Normal  Gynecologic exam deferred   Assessment/Plan:  79 y.o. G2P2   1. Weight loss counseling, encounter for Thorough counseling on weight loss including nutritional and physical activity approaches.  Recommend a low calorie/carb diet such as [employer].  Intermittent fasting reviewed.  Aerobic activities 5 times a week with weightlifting every 2 days recommended.  Given that the motivation becomes the problem to maintain the weight loss, discussion on psychologic methods to improve and keep motivation.  Patient will write down the benefits of a healthy weight and fitness versus the risks of obesity and poor fitness.  We will also write the benefits of healthy nutrition.  Patient recommended to pose reminders of her goals in the house.  2. Class 1 obesity due to excess calories with serious comorbidity and body mass index (BMI) of 34.0 to 34.9 in adult As above.  Princess Bruins MD, 3:34 PM 07/30/2020

## 2020-07-31 ENCOUNTER — Ambulatory Visit: Payer: Medicare Other | Admitting: Obstetrics & Gynecology

## 2020-08-02 ENCOUNTER — Encounter: Payer: Self-pay | Admitting: Obstetrics & Gynecology

## 2020-09-09 ENCOUNTER — Other Ambulatory Visit: Payer: Self-pay | Admitting: Obstetrics & Gynecology

## 2020-09-09 DIAGNOSIS — Z1231 Encounter for screening mammogram for malignant neoplasm of breast: Secondary | ICD-10-CM

## 2020-10-14 ENCOUNTER — Ambulatory Visit: Payer: Medicare Other

## 2020-10-15 ENCOUNTER — Ambulatory Visit: Payer: Medicare Other

## 2020-10-25 ENCOUNTER — Other Ambulatory Visit: Payer: Self-pay | Admitting: Internal Medicine

## 2020-10-29 ENCOUNTER — Encounter: Payer: Self-pay | Admitting: Obstetrics & Gynecology

## 2020-10-29 ENCOUNTER — Other Ambulatory Visit: Payer: Self-pay

## 2020-10-29 ENCOUNTER — Ambulatory Visit (INDEPENDENT_AMBULATORY_CARE_PROVIDER_SITE_OTHER): Payer: Medicare Other | Admitting: Obstetrics & Gynecology

## 2020-10-29 VITALS — BP 128/76 | Ht 62.75 in | Wt 195.0 lb

## 2020-10-29 DIAGNOSIS — Z780 Asymptomatic menopausal state: Secondary | ICD-10-CM

## 2020-10-29 DIAGNOSIS — E6609 Other obesity due to excess calories: Secondary | ICD-10-CM | POA: Diagnosis not present

## 2020-10-29 DIAGNOSIS — M8589 Other specified disorders of bone density and structure, multiple sites: Secondary | ICD-10-CM | POA: Diagnosis not present

## 2020-10-29 DIAGNOSIS — Z6834 Body mass index (BMI) 34.0-34.9, adult: Secondary | ICD-10-CM

## 2020-10-29 DIAGNOSIS — Z01419 Encounter for gynecological examination (general) (routine) without abnormal findings: Secondary | ICD-10-CM

## 2020-10-29 LAB — URINALYSIS, COMPLETE W/RFL CULTURE
Bacteria, UA: NONE SEEN /HPF
Bilirubin Urine: NEGATIVE
Glucose, UA: NEGATIVE
Hgb urine dipstick: NEGATIVE
Hyaline Cast: NONE SEEN /LPF
Ketones, ur: NEGATIVE
Leukocyte Esterase: NEGATIVE
Nitrites, Initial: NEGATIVE
Protein, ur: NEGATIVE
RBC / HPF: NONE SEEN /HPF (ref 0–2)
Specific Gravity, Urine: 1.025 (ref 1.001–1.03)
WBC, UA: NONE SEEN /HPF (ref 0–5)
pH: 6 (ref 5.0–8.0)

## 2020-10-29 LAB — NO CULTURE INDICATED

## 2020-10-29 NOTE — Progress Notes (Signed)
Abigail Long 1941/09/12 [id number]   History:    79 y.o. G2P2L2 Married.  Great-grand-mother!  RP:  Established patient presenting for annual gyn exam   HPI: Menopause, well on no hormone replacement therapy. No postmenopausal bleeding. No pelvic pain. No pain with intercourse, using [place] successfully. Urine and bowel movements normal. Breasts normal. Body mass index at 34.82.Health labs with family physician.  Past medical history,surgical history, family history and social history were all reviewed and documented in the EPIC chart.  Gynecologic History No LMP recorded. Patient is postmenopausal.  Obstetric History OB History  Gravida Para Term Preterm AB Living  2 2       2   SAB TAB Ectopic Multiple Live Births               # Outcome Date GA Lbr Len/2nd Weight Sex Delivery Anes PTL Lv  2 Para           1 Para              ROS: A ROS was performed and pertinent positives and negatives are included in the history.  GENERAL: No fevers or chills. HEENT: No change in vision, no earache, sore throat or sinus congestion. NECK: No pain or stiffness. CARDIOVASCULAR: No chest pain or pressure. No palpitations. PULMONARY: No shortness of breath, cough or wheeze. GASTROINTESTINAL: No abdominal pain, nausea, vomiting or diarrhea, melena or bright red blood per rectum. GENITOURINARY: No urinary frequency, urgency, hesitancy or dysuria. MUSCULOSKELETAL: No joint or muscle pain, no back pain, no recent trauma. DERMATOLOGIC: No rash, no itching, no lesions. ENDOCRINE: No polyuria, polydipsia, no heat or cold intolerance. No recent change in weight. HEMATOLOGICAL: No anemia or easy bruising or bleeding. NEUROLOGIC: No headache, seizures, numbness, tingling or weakness. PSYCHIATRIC: No depression, no loss of interest in normal activity or change in sleep pattern.     Exam:   BP 128/76   Ht 5' 2.75" (1.594 m)   Wt 195 lb (88.5 kg)   BMI 34.82 kg/m   Body mass index is 34.82  kg/m.  General appearance : Well developed well nourished female. No acute distress HEENT: Eyes: no retinal hemorrhage or exudates,  Neck supple, trachea midline, no carotid bruits, no thyroidmegaly Lungs: Clear to auscultation, no rhonchi or wheezes, or rib retractions  Heart: Regular rate and rhythm, no murmurs or gallops Breast:Examined in sitting and supine position were symmetrical in appearance, no palpable masses or tenderness,  no skin retraction, no nipple inversion, no nipple discharge, no skin discoloration, no axillary or supraclavicular lymphadenopathy Abdomen: no palpable masses or tenderness, no rebound or guarding Extremities: no edema or skin discoloration or tenderness  Pelvic: Vulva: Normal             Vagina: No gross lesions or discharge  Cervix: No gross lesions or discharge  Uterus AV, normal size, shape and consistency, non-tender and mobile  Adnexa  Without masses or tenderness  Anus: Normal  U/A Negative   Assessment/Plan:  79 y.o. female for annual exam   1. Well female exam with routine gynecological exam Normal gynecologic exam in menopause.  No indication for Pap test this year.  Breast exam normal.  Screening mammogram October 2020 was negative.  Colonoscopy 2012.  Health labs with family physician.  2. Postmenopausal Well on no hormone replacement therapy.  No postmenopausal bleeding.  3. Osteopenia of multiple sites Osteopenia on bone density December 2020.  We will repeat a bone density December 2022.  Vitamin  D supplements, calcium intake of 1500 mg daily and regular weightbearing physical activities.  4. Class 1 obesity due to excess calories with serious comorbidity and body mass index (BMI) of 34.0 to 34.9 in adult Recommend a lower calorie/carb diet.  Aerobic activities 5 times a week and light weightlifting every 2 days.  Other orders - Biotin 1000 MCG CHEW; Chew by mouth.  Princess Bruins MD, 11:23 AM 10/29/2020

## 2020-11-02 ENCOUNTER — Encounter: Payer: Self-pay | Admitting: Obstetrics & Gynecology

## 2020-11-04 ENCOUNTER — Ambulatory Visit
Admission: RE | Admit: 2020-11-04 | Discharge: 2020-11-04 | Disposition: A | Discharge status: Home / Self Care | Payer: Medicare Other | Source: Ambulatory Visit | Attending: Obstetrics & Gynecology | Admitting: Obstetrics & Gynecology

## 2020-11-04 ENCOUNTER — Other Ambulatory Visit: Payer: Self-pay

## 2020-11-04 DIAGNOSIS — Z1231 Encounter for screening mammogram for malignant neoplasm of breast: Secondary | ICD-10-CM

## 2020-12-01 DIAGNOSIS — D225 Melanocytic nevi of trunk: Secondary | ICD-10-CM | POA: Diagnosis not present

## 2020-12-01 DIAGNOSIS — L821 Other seborrheic keratosis: Secondary | ICD-10-CM | POA: Diagnosis not present

## 2020-12-01 DIAGNOSIS — L814 Other melanin hyperpigmentation: Secondary | ICD-10-CM | POA: Diagnosis not present

## 2020-12-01 DIAGNOSIS — L820 Inflamed seborrheic keratosis: Secondary | ICD-10-CM | POA: Diagnosis not present

## 2021-01-29 ENCOUNTER — Other Ambulatory Visit: Payer: Self-pay | Admitting: Internal Medicine

## 2021-02-26 ENCOUNTER — Other Ambulatory Visit: Payer: Self-pay | Admitting: Internal Medicine

## 2021-03-04 ENCOUNTER — Telehealth: Payer: Self-pay | Admitting: Internal Medicine

## 2021-03-04 NOTE — Telephone Encounter (Signed)
   Patient would like to know if Dr Quay Burow approved of her starting Optivia weight loss program

## 2021-03-04 NOTE — Telephone Encounter (Signed)
Yes - ok to try optivia

## 2021-03-04 NOTE — Telephone Encounter (Signed)
Spoke with patient.

## 2021-03-09 ENCOUNTER — Other Ambulatory Visit: Payer: Self-pay | Admitting: Internal Medicine

## 2021-03-17 NOTE — Progress Notes (Signed)
Subjective:    Patient ID: Abigail Long, female    DOB: 03/13/1941, 80 y.o.   MRN: [id number]  HPI The patient is here for follow up of their chronic medical problems, including htn, prediabetes, hyperlipidemia, osteopenia  She is doing the optavia diet.  She is walking regualrly.    Medications and allergies reviewed with patient and updated if appropriate.  Patient Active Problem List   Diagnosis Date Noted  . Dizziness 01/31/2019  . Prediabetes 10/08/2018  . Obese 08/25/2016  . Meniscus tear 12/18/2015  . Arthritis of right lower extremity 10/08/2015  . HTN (hypertension) 05/23/2013  . Osteopenia 07/09/2010  . Hyperlipidemia 06/19/2009  . NONSPECIFIC ABNORMAL ELECTROCARDIOGRAM 06/19/2009    Current Outpatient Medications on File Prior to Visit  Medication Sig Dispense Refill  . amLODipine (NORVASC) 5 MG tablet TAKE 1 TABLET(5 MG) BY MOUTH DAILY. FOLLOW UP FOR ADDITIONAL REFILLS 30 tablet 0  . Biotin 1000 MCG CHEW Chew by mouth.    . Calcium Carbonate-Vit D-Min (CALCIUM 1200 PO) Take by mouth daily.    . Glucosamine-Chondroit-Vit C-Mn (GLUCOSAMINE 1500 COMPLEX PO) Take by mouth daily. Take 2-3 pills daily    . Multiple Vitamins-Minerals (CENTRUM SILVER PO) Take by mouth daily.     No current facility-administered medications on file prior to visit.    Past Medical History:  Diagnosis Date  . Hypertension     Past Surgical History:  Procedure Laterality Date  . COLONOSCOPY  2008    Negative,Dr Henrene Pastor  . DILATION AND CURETTAGE OF UTERUS    . TONSILLECTOMY    . TOOTH EXTRACTION     right side of mouth and implant placed     Social History   Socioeconomic History  . Marital status: Married    Spouse name: Not on file  . Number of children: Not on file  . Years of education: Not on file  . Highest education level: Not on file  Occupational History  . Not on file  Tobacco Use  . Smoking status: Never Smoker  . Smokeless tobacco: Never Used  Vaping Use   . Vaping Use: Never used  Substance and Sexual Activity  . Alcohol use: No  . Drug use: No  . Sexual activity: Yes    Partners: Male    Comment: 1st intercourse- 1, partners- 2, married- 69 yrs   Other Topics Concern  . Not on file  Social History Narrative   Exercise:  [facility]   Social Determinants of Health   Financial Resource Strain: Not on file  Food Insecurity: Not on file  Transportation Needs: Not on file  Physical Activity: Not on file  Stress: Not on file  Social Connections: Not on file    Family History  Problem Relation Age of Onset  . Heart disease Father 27       CBAG X 3; pacer  . Hypertension Mother   . Cancer Maternal Grandfather        renal  . Cancer Paternal Grandfather        intra- abdominal  . Stroke Maternal Aunt 85  . Breast cancer Maternal Aunt   . Bipolar disorder Son   . Diabetes Neg Hx   . Hyperlipidemia Neg Hx     Review of Systems  Constitutional: Negative for chills, fatigue and fever.  Eyes: Negative for visual disturbance.  Respiratory: Positive for cough (in morning). Negative for shortness of breath and wheezing.  Cardiovascular: Positive for palpitations (occasional). Negative for chest pain and leg swelling.  Gastrointestinal: Positive for constipation. Negative for abdominal pain, blood in stool, diarrhea and nausea.       No gerd  Neurological: Positive for dizziness (rare, related to certain movements). Negative for light-headedness and headaches.       Objective:   Vitals:   03/18/21 0746  BP: 130/76  Pulse: 72  Temp: 98.8 F (37.1 C)  SpO2: 96%   BP Readings from Last 3 Encounters:  03/18/21 130/76  10/29/20 128/76  07/30/20 128/84   Wt Readings from Last 3 Encounters:  03/18/21 184 lb (83.5 kg)  10/29/20 195 lb (88.5 kg)  07/30/20 197 lb (89.4 kg)   Body mass index is 31.58 kg/m.   Physical Exam    Constitutional: Appears well-developed and well-nourished. No distress.   HENT:  Head: Normocephalic and atraumatic.  Neck: Neck supple. No tracheal deviation present. No thyromegaly present.  No cervical lymphadenopathy Cardiovascular: Normal rate, regular rhythm and normal heart sounds.   No murmur heard. No carotid bruit .  No edema Pulmonary/Chest: Effort normal and breath sounds normal. No respiratory distress. No has no wheezes. No rales.  Abdomen: soft, NT, ND Skin: Skin is warm and dry. Not diaphoretic.  Psychiatric: Normal mood and affect. Behavior is normal.      Assessment & Plan:    See Problem List for Assessment and Plan of chronic medical problems.    This visit occurred during the SARS-CoV-2 public health emergency.  Safety protocols were in place, including screening questions prior to the visit, additional usage of staff PPE, and extensive cleaning of exam room while observing appropriate contact time as indicated for disinfecting solutions.

## 2021-03-17 NOTE — Patient Instructions (Addendum)
  Blood work was ordered.     Medications changes include :   none  Your prescription(s) have been submitted to your pharmacy. Please take as directed and contact our office if you believe you are having problem(s) with the medication(s).    Please followup in 1 year

## 2021-03-18 ENCOUNTER — Ambulatory Visit (INDEPENDENT_AMBULATORY_CARE_PROVIDER_SITE_OTHER): Payer: Medicare Other | Admitting: Internal Medicine

## 2021-03-18 ENCOUNTER — Other Ambulatory Visit: Payer: Self-pay

## 2021-03-18 ENCOUNTER — Encounter: Payer: Self-pay | Admitting: Internal Medicine

## 2021-03-18 VITALS — BP 130/76 | HR 72 | Temp 98.8°F | Ht 64.0 in | Wt 184.0 lb

## 2021-03-18 DIAGNOSIS — Z1159 Encounter for screening for other viral diseases: Secondary | ICD-10-CM

## 2021-03-18 DIAGNOSIS — M8589 Other specified disorders of bone density and structure, multiple sites: Secondary | ICD-10-CM

## 2021-03-18 DIAGNOSIS — E782 Mixed hyperlipidemia: Secondary | ICD-10-CM | POA: Diagnosis not present

## 2021-03-18 DIAGNOSIS — Z6831 Body mass index (BMI) 31.0-31.9, adult: Secondary | ICD-10-CM

## 2021-03-18 DIAGNOSIS — R7303 Prediabetes: Secondary | ICD-10-CM

## 2021-03-18 DIAGNOSIS — E6609 Other obesity due to excess calories: Secondary | ICD-10-CM

## 2021-03-18 DIAGNOSIS — I10 Essential (primary) hypertension: Secondary | ICD-10-CM | POA: Diagnosis not present

## 2021-03-18 LAB — CBC WITH DIFFERENTIAL/PLATELET
Basophils Absolute: 0 10*3/uL (ref 0.0–0.1)
Basophils Relative: 0.9 % (ref 0.0–3.0)
Eosinophils Absolute: 0.1 10*3/uL (ref 0.0–0.7)
Eosinophils Relative: 2.1 % (ref 0.0–5.0)
HCT: 43.7 % (ref 36.0–46.0)
Hemoglobin: 14.6 g/dL (ref 12.0–15.0)
Lymphocytes Relative: 22.8 % (ref 12.0–46.0)
Lymphs Abs: 1.1 10*3/uL (ref 0.7–4.0)
MCHC: 33.4 g/dL (ref 30.0–36.0)
MCV: 82.7 fl (ref 78.0–100.0)
Monocytes Absolute: 0.6 10*3/uL (ref 0.1–1.0)
Monocytes Relative: 12.3 % — ABNORMAL HIGH (ref 3.0–12.0)
Neutro Abs: 3 10*3/uL (ref 1.4–7.7)
Neutrophils Relative %: 61.9 % (ref 43.0–77.0)
Platelets: 293 10*3/uL (ref 150.0–400.0)
RBC: 5.29 Mil/uL — ABNORMAL HIGH (ref 3.87–5.11)
RDW: 13.9 % (ref 11.5–15.5)
WBC: 4.9 10*3/uL (ref 4.0–10.5)

## 2021-03-18 LAB — COMPREHENSIVE METABOLIC PANEL
ALT: 22 U/L (ref 0–35)
AST: 25 U/L (ref 0–37)
Albumin: 4.6 g/dL (ref 3.5–5.2)
Alkaline Phosphatase: 46 U/L (ref 39–117)
BUN: 21 mg/dL (ref 6–23)
CO2: 29 mEq/L (ref 19–32)
Calcium: 9.8 mg/dL (ref 8.4–10.5)
Chloride: 102 mEq/L (ref 96–112)
Creatinine, Ser: 0.72 mg/dL (ref 0.40–1.20)
GFR: 80.3 mL/min (ref >60.00)
Glucose, Bld: 90 mg/dL (ref 70–99)
Potassium: 4.6 mEq/L (ref 3.5–5.1)
Sodium: 139 mEq/L (ref 135–145)
Total Bilirubin: 0.5 mg/dL (ref 0.2–1.2)
Total Protein: 7.5 g/dL (ref 6.0–8.3)

## 2021-03-18 LAB — LIPID PANEL
Cholesterol: 140 mg/dL (ref 0–200)
HDL: 62.1 mg/dL (ref >39.00)
LDL Cholesterol: 68 mg/dL (ref 0–99)
NonHDL: 78.25
Total CHOL/HDL Ratio: 2
Triglycerides: 53 mg/dL (ref 0.0–149.0)
VLDL: 10.6 mg/dL (ref 0.0–40.0)

## 2021-03-18 LAB — HEMOGLOBIN A1C: Hgb A1c MFr Bld: 5.7 % (ref 4.6–6.5)

## 2021-03-18 MED ORDER — AMLODIPINE BESYLATE 5 MG PO TABS: ORAL_TABLET | ORAL | 3 refills | Status: DC

## 2021-03-18 NOTE — Assessment & Plan Note (Signed)
Chronic Doing optavia diet Walking Continue weight loss efforts

## 2021-03-18 NOTE — Assessment & Plan Note (Signed)
Chronic dexa up to date Walking for exercise Continue calcium and vitamin d daily

## 2021-03-18 NOTE — Assessment & Plan Note (Signed)
Chronic Check lipid panel  Lifestyle controlled Regular exercise and healthy diet encouraged  

## 2021-03-18 NOTE — Addendum Note (Signed)
Addended by: Jacobo Forest on: 03/18/2021 08:17 AM   Modules accepted: Orders

## 2021-03-18 NOTE — Assessment & Plan Note (Addendum)
Chronic BP well controlled Continue amlodipein 5 mg daily Cmp, cbc

## 2021-03-18 NOTE — Assessment & Plan Note (Signed)
Chronic Check a1c Low sugar / carb diet Stressed regular exercise  

## 2021-03-19 LAB — HEPATITIS C ANTIBODY
Hepatitis C Ab: NONREACTIVE
SIGNAL TO CUT-OFF: 0.02 (ref <1.00)

## 2021-04-08 ENCOUNTER — Telehealth (INDEPENDENT_AMBULATORY_CARE_PROVIDER_SITE_OTHER): Payer: Medicare Other | Admitting: Internal Medicine

## 2021-04-08 ENCOUNTER — Encounter: Payer: Self-pay | Admitting: Internal Medicine

## 2021-04-08 DIAGNOSIS — J069 Acute upper respiratory infection, unspecified: Secondary | ICD-10-CM

## 2021-04-08 NOTE — Progress Notes (Signed)
Virtual Visit via Video Note  I connected with Abigail Long on 04/08/21 at  2:40 PM EDT by a video enabled telemedicine application and verified that I am speaking with the correct person using two identifiers.   I discussed the limitations of evaluation and management by telemedicine and the availability of in person appointments. The patient expressed understanding and agreed to proceed.  Present for the visit:  Myself, Dr Billey Gosling, [name].  The patient is currently at home and I am in the office.    No referring provider.    History of Present Illness: She is here for an acute visit for cold symptoms.   Her symptoms started couple of days ago  She is experiencing Sneezing, right nasal congestion only, cough related to drainage.  She has had some diarrhea and constipation, but feels that is related to the diet she is doing and that has resolved itself.  She denies shortness of breath, wheezing, sinus pain, sore throat and fevers  She has tried taking  tylenol.  She does feel little bit better today than yesterday.  Her son advised that she call for possible antibiotics because his wedding is in 10 days and he was concerned about her being sick for it.  Review of Systems  Constitutional: Negative for chills and fever.  HENT: Positive for congestion (right nasal only - better now). Negative for ear pain, sinus pain and sore throat.        Sneezing  Respiratory: Positive for cough. Negative for shortness of breath and wheezing.   Gastrointestinal: Positive for constipation and diarrhea.  Neurological: Negative for dizziness and headaches.      Social History   Socioeconomic History  . Marital status: Married    Spouse name: Not on file  . Number of children: Not on file  . Years of education: Not on file  . Highest education level: Not on file  Occupational History  . Not on file  Tobacco Use  . Smoking status: Never Smoker  . Smokeless tobacco: Never Used   Vaping Use  . Vaping Use: Never used  Substance and Sexual Activity  . Alcohol use: No  . Drug use: No  . Sexual activity: Yes    Partners: Male    Comment: 1st intercourse- 63, partners- 2, married- 79 yrs   Other Topics Concern  . Not on file  Social History Narrative   Exercise:  [facility]   Social Determinants of Health   Financial Resource Strain: Not on file  Food Insecurity: Not on file  Transportation Needs: Not on file  Physical Activity: Not on file  Stress: Not on file  Social Connections: Not on file     Observations/Objective: Appears well in NAD Breathing normally Skin appears warm and dry  Assessment and Plan:  See Problem List for Assessment and Plan of chronic medical problems.   Follow Up Instructions:    I discussed the assessment and treatment plan with the patient. The patient was provided an opportunity to ask questions and all were answered. The patient agreed with the plan and demonstrated an understanding of the instructions.   The patient was advised to call back or seek an in-person evaluation if the symptoms worsen or if the condition fails to improve as anticipated.    Binnie Rail, MD

## 2021-04-08 NOTE — Assessment & Plan Note (Signed)
Acute Symptoms likely viral in nature Continue symptomatic treatment with over-the-counter cold medications, Tylenol Increase rest and fluids Call if symptoms worsen or do not improve

## 2021-05-05 ENCOUNTER — Encounter: Payer: Self-pay | Admitting: Internal Medicine

## 2021-05-15 ENCOUNTER — Encounter: Payer: Self-pay | Admitting: Internal Medicine

## 2021-06-08 NOTE — Progress Notes (Signed)
Subjective:    Patient ID: Abigail Long, female    DOB: 12/05/1941, 80 y.o.   MRN: [id number]  HPI The patient is here for an acute visit.   BP at home well controlled - 99/64 - 130/72.  She is taking all of her medications as prescribed.    Leg cramps -  occur at night.  Occurs in both legs.   Can be in calves, lateral aspect of lower leg, feet.    She is drinking lots of water.    Medications and allergies reviewed with patient and updated if appropriate.  Patient Active Problem List   Diagnosis Date Noted   Dizziness 01/31/2019   Prediabetes 10/08/2018   URI (upper respiratory infection) 11/04/2017   Obese 08/25/2016   Meniscus tear 12/18/2015   Arthritis of right lower extremity 10/08/2015   HTN (hypertension) 05/23/2013   Osteopenia 07/09/2010   Hyperlipidemia 06/19/2009   NONSPECIFIC ABNORMAL ELECTROCARDIOGRAM 06/19/2009    Current Outpatient Medications on File Prior to Visit  Medication Sig Dispense Refill   amLODipine (NORVASC) 5 MG tablet TAKE 1 TABLET(5 MG) BY MOUTH DAILY. 90 tablet 3   Biotin 1000 MCG CHEW Chew by mouth.     Calcium Carbonate-Vit D-Min (CALCIUM 1200 PO) Take by mouth daily.     Glucosamine-Chondroit-Vit C-Mn (GLUCOSAMINE 1500 COMPLEX PO) Take by mouth daily. Take 2-3 pills daily     Multiple Vitamins-Minerals (CENTRUM SILVER PO) Take by mouth daily.     No current facility-administered medications on file prior to visit.    Past Medical History:  Diagnosis Date   Hypertension     Past Surgical History:  Procedure Laterality Date   COLONOSCOPY  2008    Negative,Dr Henrene Pastor   DILATION AND CURETTAGE OF UTERUS     TONSILLECTOMY     TOOTH EXTRACTION     right side of mouth and implant placed     Social History   Socioeconomic History   Marital status: Married    Spouse name: Not on file   Number of children: Not on file   Years of education: Not on file   Highest education level: Not on file  Occupational History   Not on  file  Tobacco Use   Smoking status: Never   Smokeless tobacco: Never  Vaping Use   Vaping Use: Never used  Substance and Sexual Activity   Alcohol use: No   Drug use: No   Sexual activity: Yes    Partners: Male    Comment: 1st intercourse- 70, partners- 2, married- 40 yrs   Other Topics Concern   Not on file  Social History Narrative   Exercise:  [facility] three [facility]   Social Determinants of Radio broadcast assistant Strain: Not on file  Food Insecurity: Not on file  Transportation Needs: Not on file  Physical Activity: Not on file  Stress: Not on file  Social Connections: Not on file    Family History  Problem Relation Age of Onset   Heart disease Father 76       CBAG X 3; pacer   Hypertension Mother    Cancer Maternal Grandfather        renal   Cancer Paternal Grandfather        intra- abdominal   Stroke Maternal Aunt 85   Breast cancer Maternal Aunt    Bipolar disorder Son    Diabetes Neg Hx    Hyperlipidemia Neg Hx  Review of Systems  Constitutional:  Negative for fever.  Respiratory:  Negative for shortness of breath.   Cardiovascular:  Negative for chest pain, palpitations and leg swelling.  Neurological:  Negative for headaches.      Objective:   Vitals:   06/09/21 1301  BP: 128/70  Pulse: (!) 56  Temp: 98.3 F (36.8 C)  SpO2: 95%   BP Readings from Last 3 Encounters:  06/09/21 128/70  03/18/21 130/76  10/29/20 128/76   Wt Readings from Last 3 Encounters:  06/09/21 160 lb (72.6 kg)  03/18/21 184 lb (83.5 kg)  10/29/20 195 lb (88.5 kg)   Body mass index is 27.46 kg/m.   Physical Exam    Constitutional: Appears well-developed and well-nourished. No distress.  Head: Normocephalic and atraumatic.  Neck: Neck supple. No tracheal deviation present. No thyromegaly present.  No cervical lymphadenopathy Cardiovascular: Normal rate, regular rhythm and normal heart sounds.  No murmur heard.  No edema Pulmonary/Chest: Effort  normal and breath sounds normal. No respiratory distress. No has no wheezes. No rales.  Skin: Skin is warm and dry. Not diaphoretic.       Assessment & Plan:    See Problem List for Assessment and Plan of chronic medical problems.    This visit occurred during the SARS-CoV-2 public health emergency.  Safety protocols were in place, including screening questions prior to the visit, additional usage of staff PPE, and extensive cleaning of exam room while observing appropriate contact time as indicated for disinfecting solutions.

## 2021-06-09 ENCOUNTER — Encounter: Payer: Self-pay | Admitting: Internal Medicine

## 2021-06-09 ENCOUNTER — Ambulatory Visit (INDEPENDENT_AMBULATORY_CARE_PROVIDER_SITE_OTHER): Payer: Medicare Other | Admitting: Internal Medicine

## 2021-06-09 ENCOUNTER — Other Ambulatory Visit: Payer: Self-pay

## 2021-06-09 DIAGNOSIS — R252 Cramp and spasm: Secondary | ICD-10-CM | POA: Insufficient documentation

## 2021-06-09 DIAGNOSIS — I10 Essential (primary) hypertension: Secondary | ICD-10-CM | POA: Diagnosis not present

## 2021-06-09 MED ORDER — AMLODIPINE BESYLATE 2.5 MG PO TABS: 2.5000 mg | ORAL_TABLET | Freq: Every day | ORAL | 5 refills | Status: DC

## 2021-06-09 NOTE — Assessment & Plan Note (Signed)
Acute At night - one leg or the other No obvious back issues, orthopedic issues Drinks plenty of water Try magnesium supplement Continue calcium and vitamin d daily

## 2021-06-09 NOTE — Assessment & Plan Note (Signed)
Chronic BP well controlled - too well controlled -- related to weight loss Decrease amlodipine to 2.5 mg daily

## 2021-06-09 NOTE — Patient Instructions (Addendum)
     Medications changes include :   decrease amlodipine 2.5 mg daily.  Try taking otc magnesium for your leg cramping.      Your prescription(s) have been submitted to your pharmacy. Please take as directed and contact our office if you believe you are having problem(s) with the medication(s).

## 2021-06-25 ENCOUNTER — Telehealth: Payer: Self-pay | Admitting: Internal Medicine

## 2021-06-25 NOTE — Telephone Encounter (Signed)
LVM for pt to rtn my call to schedule AWV with [facility]. Please schedule AWV if pt calls the office  

## 2021-07-03 DIAGNOSIS — H5203 Hypermetropia, bilateral: Secondary | ICD-10-CM | POA: Diagnosis not present

## 2021-07-03 DIAGNOSIS — H2513 Age-related nuclear cataract, bilateral: Secondary | ICD-10-CM | POA: Diagnosis not present

## 2021-07-03 DIAGNOSIS — H52203 Unspecified astigmatism, bilateral: Secondary | ICD-10-CM | POA: Diagnosis not present

## 2021-07-14 ENCOUNTER — Telehealth: Payer: Self-pay

## 2021-07-14 ENCOUNTER — Encounter: Payer: Self-pay | Admitting: Family Medicine

## 2021-07-14 ENCOUNTER — Other Ambulatory Visit: Payer: Self-pay

## 2021-07-14 ENCOUNTER — Ambulatory Visit (INDEPENDENT_AMBULATORY_CARE_PROVIDER_SITE_OTHER): Payer: Medicare Other | Admitting: Family Medicine

## 2021-07-14 VITALS — BP 120/70 | HR 63 | Temp 98.3°F | Ht 64.0 in | Wt 153.5 lb

## 2021-07-14 DIAGNOSIS — W57XXXA Bitten or stung by nonvenomous insect and other nonvenomous arthropods, initial encounter: Secondary | ICD-10-CM

## 2021-07-14 DIAGNOSIS — S80861A Insect bite (nonvenomous), right lower leg, initial encounter: Secondary | ICD-10-CM | POA: Diagnosis not present

## 2021-07-14 DIAGNOSIS — L282 Other prurigo: Secondary | ICD-10-CM

## 2021-07-14 MED ORDER — TRIAMCINOLONE ACETONIDE 0.1 % EX CREA: 1.0000 "application " | TOPICAL_CREAM | Freq: Two times a day (BID) | CUTANEOUS | 0 refills | Status: DC

## 2021-07-14 MED ORDER — FAMOTIDINE 20 MG PO TABS: 20.0000 mg | ORAL_TABLET | Freq: Two times a day (BID) | ORAL | 0 refills | Status: DC

## 2021-07-14 NOTE — Telephone Encounter (Signed)
Spoke with patient and got her in with Dr. [place] today at 3:00 pm.

## 2021-07-14 NOTE — Progress Notes (Signed)
Acute Office Visit  Subjective:    Patient ID: Abigail Long, female    DOB: [birth year], 80 y.o.   MRN: [id number]  Chief Complaint  Patient presents with   Rash  Ms. Ritzman is a 80 yo female with hx of prediabetes,HTN,and HLD c/o pruritic rash. Started a day after she walked on tall grass, up to her hips, for about 10 feet.  Rash This is a new problem. The current episode started in the past 7 days. The problem is unchanged. The affected locations include the right upper leg, right lower leg, left lower leg, left upper leg, right shoulder and back. The rash is characterized by itchiness and redness. She was exposed to plant contact. Pertinent negatives include no anorexia, congestion, cough, diarrhea, facial edema, fatigue, fever, joint pain, nail changes, rhinorrhea, shortness of breath, sore throat or vomiting. Past treatments include antihistamine and anti-itch cream. The treatment provided no relief. There is no history of allergies, asthma or eczema.  No sick contact, recent travel, new medications,or new soap/detergent/body product. Problem is mainly affecting LE's. She has taken Benadryl at night, which helps her sleep despite of pruritus.  Past Medical History:  Diagnosis Date   Hypertension    Past Surgical History:  Procedure Laterality Date   COLONOSCOPY  2008    Negative,Dr Henrene Pastor   DILATION AND CURETTAGE OF UTERUS     TONSILLECTOMY     TOOTH EXTRACTION     right side of mouth and implant placed    Family History  Problem Relation Age of Onset   Heart disease Father 71       CBAG X 3; pacer   Hypertension Mother    Cancer Maternal Grandfather        renal   Cancer Paternal Grandfather        intra- abdominal   Stroke Maternal Aunt 85   Breast cancer Maternal Aunt    Bipolar disorder Son    Diabetes Neg Hx    Hyperlipidemia Neg Hx     Social History   Socioeconomic History   Marital status: Married    Spouse name: Not on file   Number of children: Not  on file   Years of education: Not on file   Highest education level: Not on file  Occupational History   Not on file  Tobacco Use   Smoking status: Never   Smokeless tobacco: Never  Vaping Use   Vaping Use: Never used  Substance and Sexual Activity   Alcohol use: No   Drug use: No   Sexual activity: Yes    Partners: Male    Comment: 1st intercourse- 39, partners- 2, married- 67 yrs   Other Topics Concern   Not on file  Social History Narrative   Ex[facility]   Social Determinants of Radio broadcast assistant Strain: Not on file  Food Insecurity: Not on file  Transportation Needs: Not on file  Physical Activity: Not on file  Stress: Not on file  Social Connections: Not on file  Intimate Partner Violence: Not on file    Outpatient Medications Prior to Visit  Medication Sig Dispense Refill   amLODipine (NORVASC) 2.5 MG tablet Take 1 tablet (2.5 mg total) by mouth daily. 30 tablet 5   Biotin 1000 MCG CHEW Chew by mouth.     Calcium Carbonate-Vit D-Min (CALCIUM 1200 PO) Take by mouth daily.     Glucosamine-Chondroit-Vit C-Mn (GLUCOSAMINE 1500  COMPLEX PO) Take by mouth daily. Take 2-3 pills daily     Multiple Vitamins-Minerals (CENTRUM SILVER PO) Take by mouth daily.     No facility-administered medications prior to visit.   No Known Allergies  Review of Systems  Constitutional:  Negative for activity change, appetite change, fatigue and fever.  HENT:  Negative for congestion, mouth sores, rhinorrhea and sore throat.   Eyes:  Negative for discharge and redness.  Respiratory:  Negative for cough, shortness of breath and wheezing.   Gastrointestinal:  Negative for abdominal pain, anorexia, diarrhea, nausea and vomiting.  Musculoskeletal:  Negative for gait problem, joint pain and myalgias.  Skin:  Positive for rash. Negative for nail changes.  Neurological:  Negative for syncope, weakness and headaches.  Hematological:  Negative for  adenopathy. Does not bruise/bleed easily.     Objective:   BP 120/70   Pulse 63   Temp 98.3 F (36.8 C) (Oral)   Ht '5\' 4"'$  (1.626 m)   Wt 153 lb 8 oz (69.6 kg)   SpO2 98%   BMI 26.35 kg/m   Physical Exam Vitals and nursing note reviewed.  Constitutional:      General: She is not in acute distress.    Appearance: She is well-developed and well-groomed.  HENT:     Head: Normocephalic and atraumatic.     Mouth/Throat:     Mouth: Mucous membranes are moist.     Pharynx: Oropharynx is clear.  Eyes:     Conjunctiva/sclera: Conjunctivae normal.  Cardiovascular:     Rate and Rhythm: Normal rate and regular rhythm.     Heart sounds: No murmur heard. Pulmonary:     Effort: Pulmonary effort is normal. No respiratory distress.     Breath sounds: Normal breath sounds.  Musculoskeletal:        General: No tenderness. Normal range of motion.  Lymphadenopathy:     Cervical: No cervical adenopathy.  Skin:    General: Skin is warm.     Findings: Rash present. Rash is papular and vesicular.          Comments: Scattered around ankles, back of LE's,some on lower back and a couple on upper back. Some lesions are healing.   Neurological:     General: No focal deficit present.     Mental Status: She is alert and oriented to person, place, and time.     Gait: Gait normal.     Assessment & Plan:   Problem List Items Addressed This Visit   None Visit Diagnoses     Pruritic rash    -  Primary   Relevant Medications   triamcinolone cream (KENALOG) 0.1 %   famotidine (PEPCID) 20 MG tablet   Insect bite, unspecified site, initial encounter          We discussed possible etiologies. Hx,characteristics, and distribution of rash suggest chigger bites. Some lesions seem to be healing. Recommend stopping Benadryl. Pepcid 20 mg bid and Zyrtec 10 mg daily. We discussed some side effects of oral Prednisone, she prefers to hold on this. Topical Triamcinolone, small amount at the time, bid  on affected area. Instructed to monitor for new symptoms. Instructed about warning signs.  Meds ordered this encounter  Medications   triamcinolone cream (KENALOG) 0.1 %    Sig: Apply 1 application topically 2 (two) times daily.    Dispense:  30 g    Refill:  0   famotidine (PEPCID) 20 MG tablet    Sig: Take 1  tablet (20 mg total) by mouth 2 (two) times daily for 14 days.    Dispense:  28 tablet    Refill:  0    Return if symptoms worsen or fail to improve.  Diara Chaudhari G. Martin[place], MD [facility]. [place] office.

## 2021-07-14 NOTE — Patient Instructions (Signed)
A few things to remember from today's visit:  Pruritic rash - Plan: triamcinolone cream (KENALOG) 0.1 %, famotidine (PEPCID) 20 MG tablet  If you need refills please call your pharmacy. Do not use My Chart to request refills or for acute issues that need immediate attention.   It seems like chiggers bites. Repellent, avoid exposure to tall grass, and check you outdoor cat.  Topical steroid cream 2 times daily for 14 days. Pepcid 20 mg 2 times per day and Zyrtec 10 mg daily for 2-3 weeks. Monitor for new symptoms.  Insect Bite, Adult An insect bite can make your skin red, itchy, and swollen. Some insects can spread disease to people with a bite. However, most insect bites do not lead todisease, and most are not serious. What are the causes? Insects may bite for many reasons, including: Hunger. To defend themselves. Insects that bite include: Spiders. Mosquitoes. Ticks. Fleas. Ants. Flies. Kissing bugs. Chiggers. What are the signs or symptoms? Symptoms of this condition include: Itching or pain in the bite area. Redness and swelling in the bite area. An open wound (skin ulcer). Symptoms often last for 2-4 days. In rare cases, a person may have a very bad allergic reaction (anaphylactic reaction) to a bite. Symptoms of an anaphylactic reaction may include: Feeling warm in the face (flushed). Your face may turn red. Itchy, red, swollen areas of skin (hives). Swelling of the: Eyes. Lips. Face. Mouth. Tongue. Throat. Trouble with any of these: Breathing. Talking. Swallowing. Loud breathing (wheezing). Feeling dizzy or light-headed. Passing out (fainting). Pain or cramps in your belly. Throwing up (vomiting). Watery poop (diarrhea). How is this treated? Treatment is usually not needed. Symptoms often go away on their own. When treatment is needed, it may involve: Putting a cream or lotion on the bite area. This helps with itching. Taking an antibiotic medicine.  This treatment is needed if the bite area gets infected. Getting a tetanus shot, if you are not up to date on this vaccine. Putting ice on the affected area. Using medicines called antihistamines. This treatment may be needed if you have itching or an allergic reaction to the insect bite. Giving yourself a shot of medicine (epinephrine) using an auto-injector "pen" if you have an anaphylactic reaction to a bite. Your doctor will teach you how to use this pen. Follow these instructions at home: Bite area care  Do not scratch the bite area. Keep the bite area clean and dry. Wash the bite area every day with soap and water as told by your doctor. Check the bite area every day for signs of infection. Check for: Redness, swelling, or pain. Fluid or blood. Warmth. Pus or a bad smell.  Managing pain, itching, and swelling  You may put any of these on the bite area as told by your doctor: A paste made of baking soda and water. Cortisone cream. Calamine lotion. If told, put ice on the bite area. Put ice in a plastic bag. Place a towel between your skin and the bag. Leave the ice on for 20 minutes, 2-3 times a day.  General instructions Apply or take over-the-counter and prescription medicines only as told by your doctor. If you were prescribed an antibiotic medicine, take or apply it as told by your doctor. Do not stop using the antibiotic even if your condition improves. Keep all follow-up visits as told by your doctor. This is important. How is this prevented? To help you have a lower risk of insect bites: When  you are outside, wear clothing that covers your arms and legs. Use insect repellent. The best insect repellents contain one of these: DEET. Picaridin. Oil of lemon eucalyptus (OLE). [id number]. Consider spraying your clothing with a pesticide called permethrin. Permethrin helps prevent insect bites. It works for several weeks and for up to 5-6 clothing washes. Do not apply  permethrin directly to the skin. If your home windows do not have screens, think about putting some in. If you will be sleeping in an area where there are mosquitoes, consider covering your sleeping area with a mosquito net. Contact a doctor if: You have redness, swelling, or pain in the bite area. You have fluid or blood coming from the bite area. The bite area feels warm to the touch. You have pus or a bad smell coming from the bite area. You have a fever. Get help right away if: You have joint pain. You have a rash. You feel more tired or sleepy than you normally do. You have neck pain. You have a headache. You feel weaker than you normally do. You have signs of an anaphylactic reaction. Signs may include: Feeling warm in the face. Itchy, red, swollen areas of skin. Swelling of your: Eyes. Lips. Face. Mouth. Tongue. Throat. Trouble with any of these: Breathing. Talking. Swallowing. Loud breathing. Feeling dizzy or light-headed. Passing out. Pain or cramps in your belly. Throwing up. Watery poop. These symptoms may be an emergency. Do not wait to see if the symptoms will go away. Do this right away: Use your auto-injector pen as you have been told. Get medical help. Call your local emergency services (911 in the [place] Do not drive yourself to the hospital. Summary An insect bite can make your skin red, itchy, and swollen. Treatment is usually not needed. Symptoms often go away on their own. Do not scratch the bite area. Keep it clean and dry. Ice can help with pain and itching from the bite. This information is not intended to replace advice given to you by your health care provider. Make sure you discuss any questions you have with your healthcare provider. Document Revised: 10/28/2020 Document Reviewed: 06/16/2018 Elsevier Patient Education  2022 [place].  Please be sure medication list is accurate. If a new problem present, please set up appointment sooner  than planned today.

## 2021-07-17 IMAGING — MG DIGITAL SCREENING BILAT W/ TOMO W/ CAD
8 series · 8 of 24 positions shown · non-contrast
Comparison: Previous exam(s).

CLINICAL DATA: Screening.

EXAM:
DIGITAL SCREENING BILATERAL MAMMOGRAM WITH TOMO AND CAD

[L MLO synth-2D]
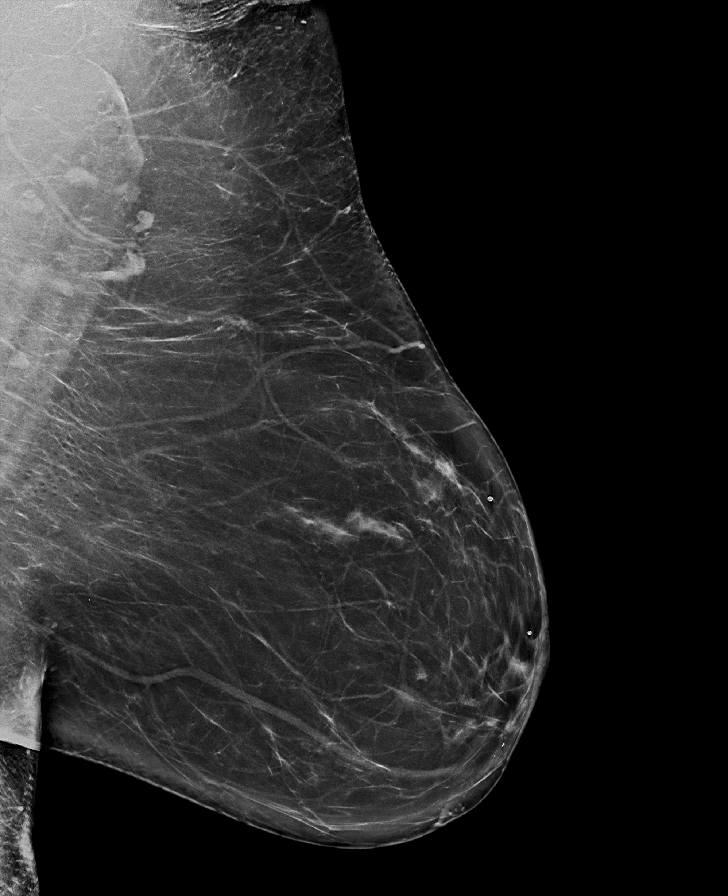

[L CC synth-2D]
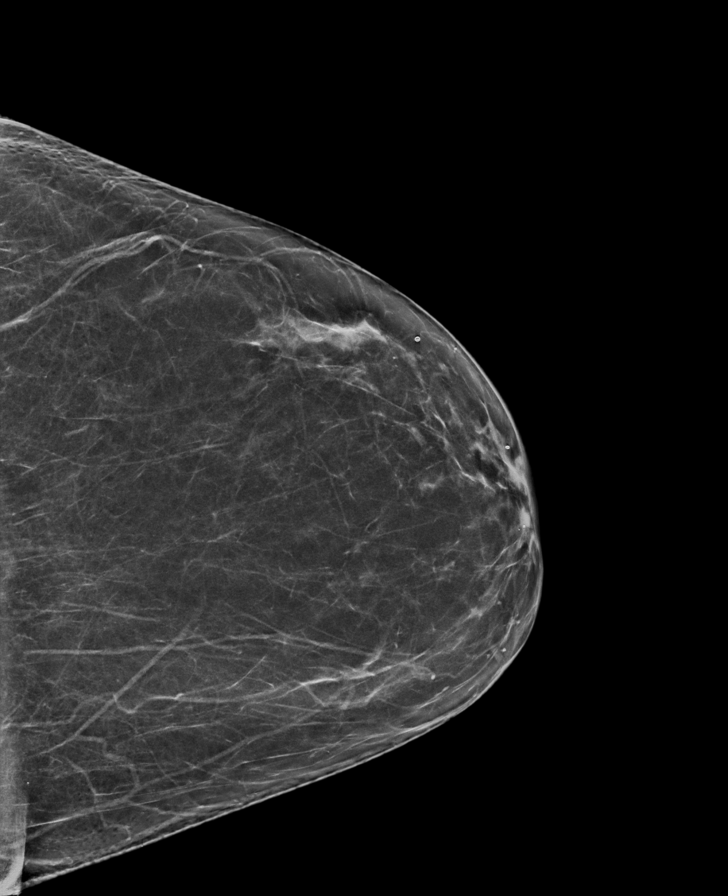

[R MLO synth-2D]
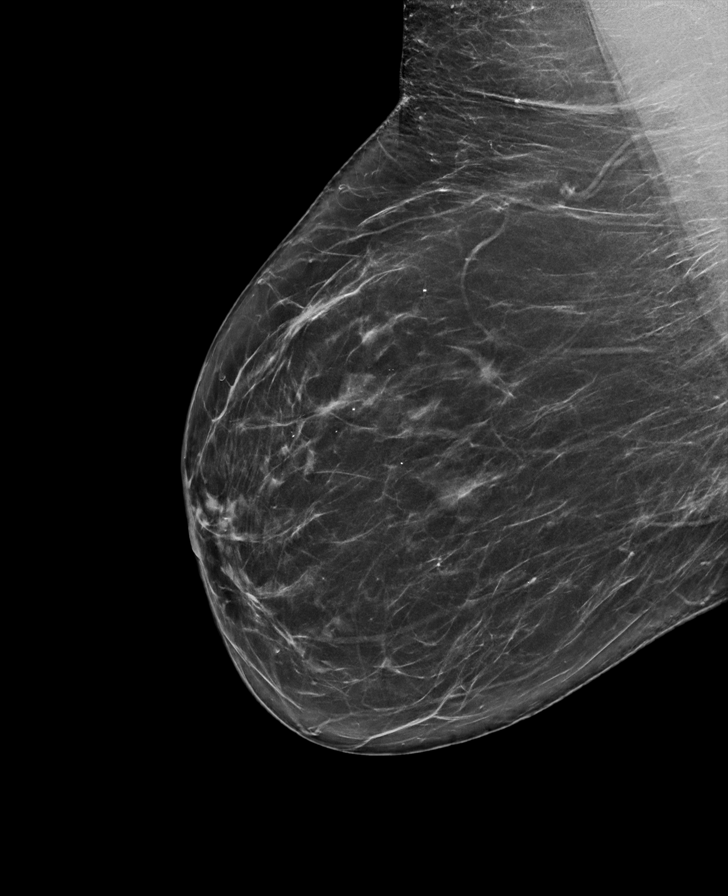

[R CC synth-2D]
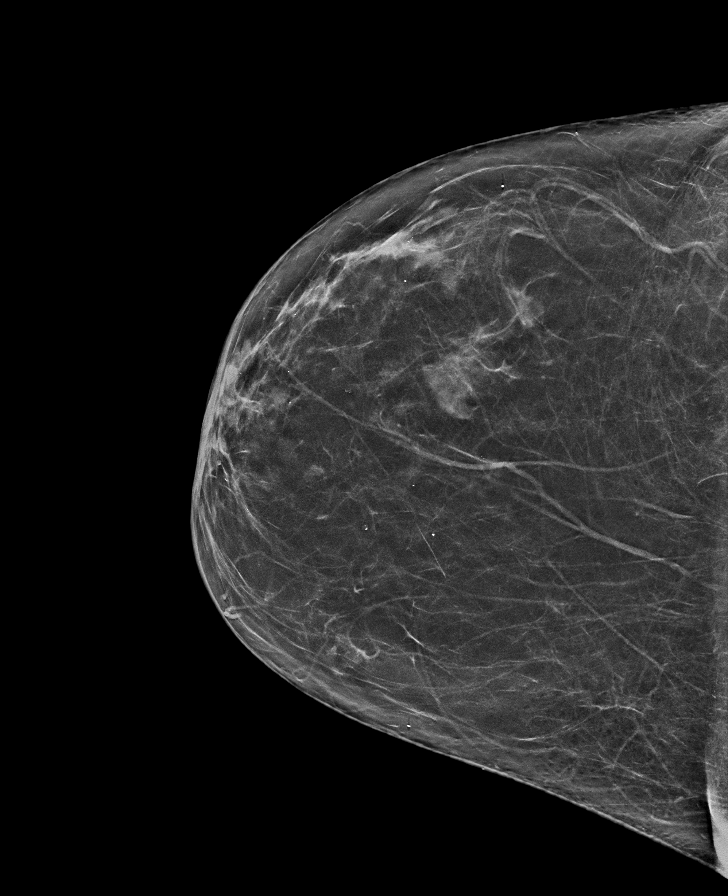

[L CC tomo · tomo slice 35/70.0]
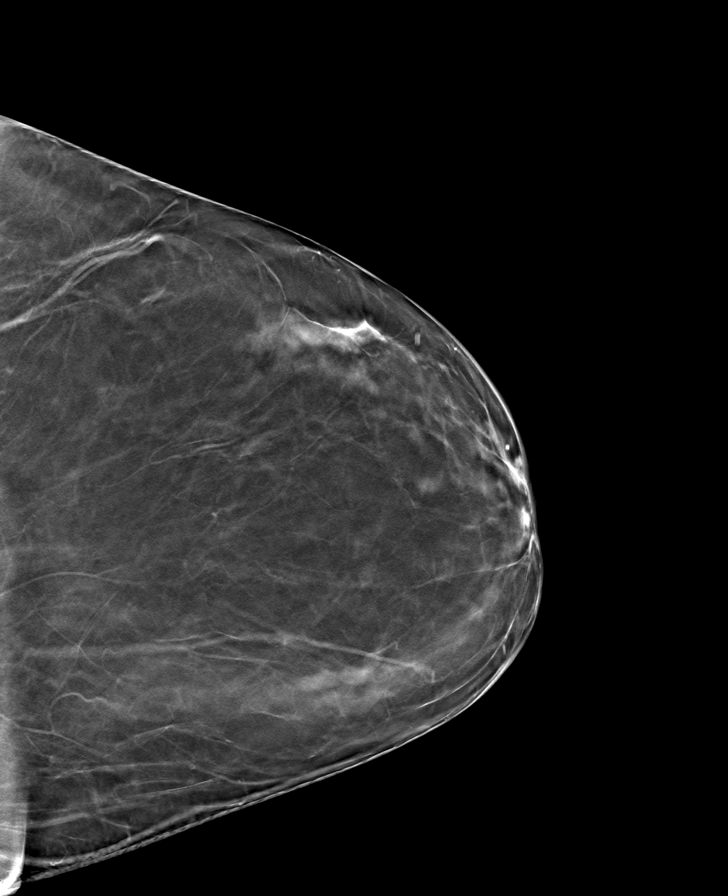

[R CC tomo · tomo slice 35/70.0]
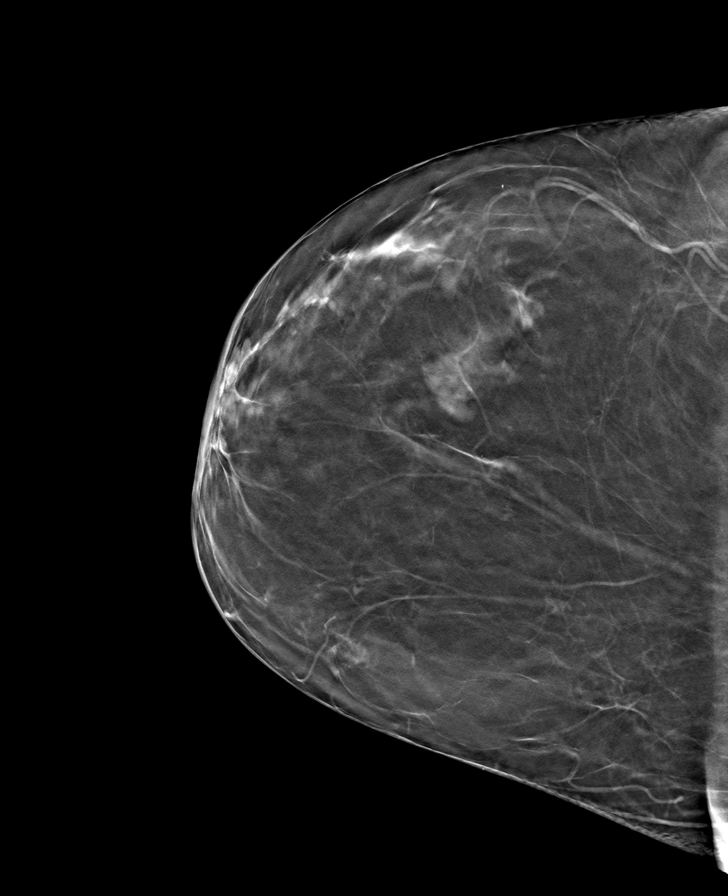

[R MLO tomo · tomo slice 39/77.0]
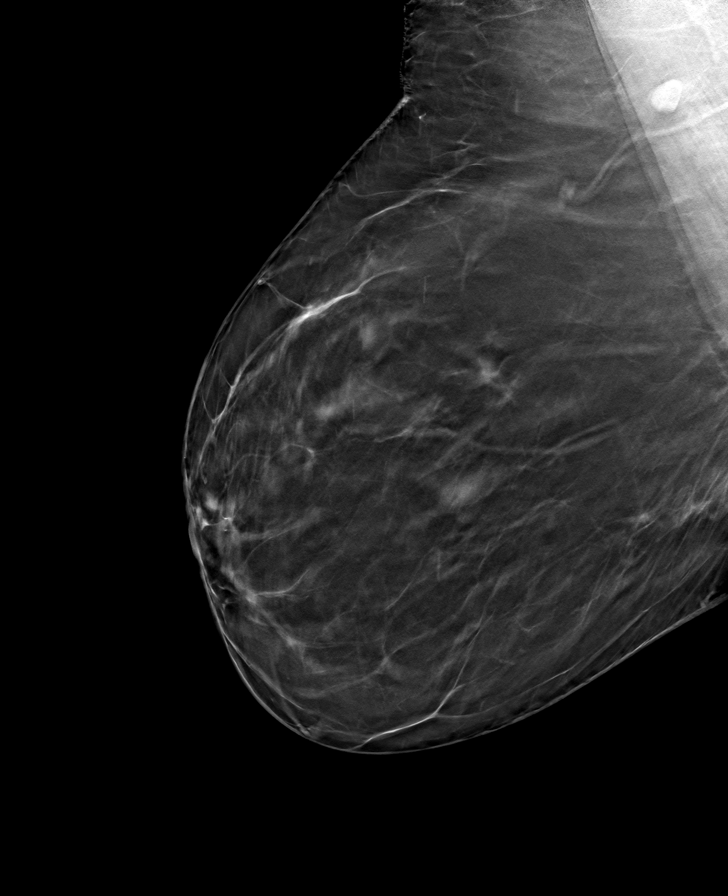

[L MLO tomo · tomo slice 42/83.0]
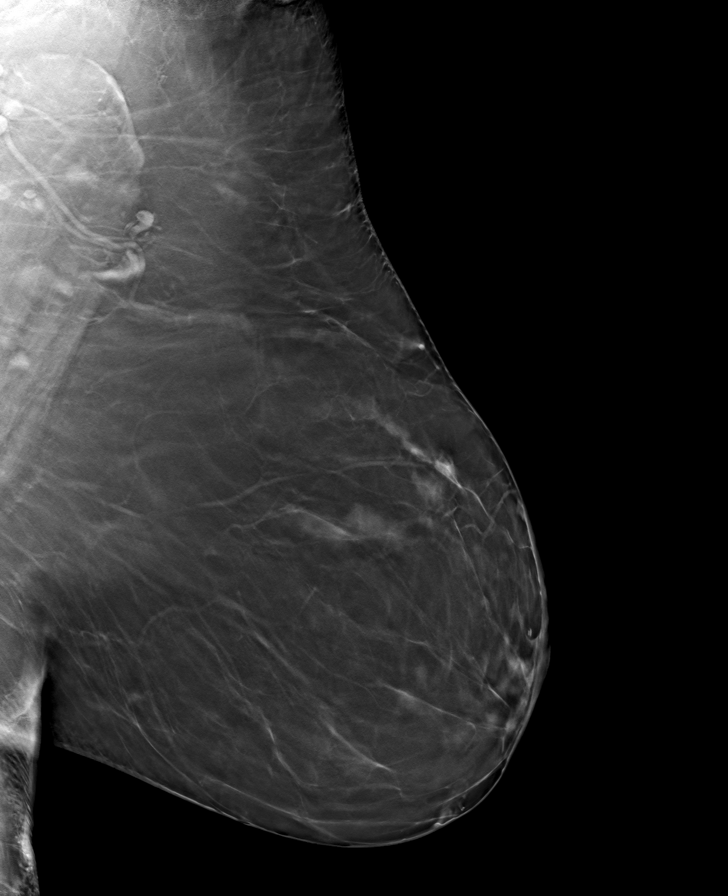

[8 of 24 positions shown; findings below may reference images not displayed]

ACR Breast Density Category b: There are scattered areas of
fibroglandular density.
FINDINGS: There are no findings suspicious for malignancy. Images were
processed with CAD.
IMPRESSION: No mammographic evidence of malignancy. A result letter of this
screening mammogram will be mailed directly to the patient.

RECOMMENDATION:
Screening mammogram in one year. (Code:[id number])

BI-RADS CATEGORY  1: Negative.

## 2021-08-05 ENCOUNTER — Other Ambulatory Visit: Payer: Self-pay

## 2021-08-05 ENCOUNTER — Ambulatory Visit (AMBULATORY_SURGERY_CENTER): Payer: Self-pay

## 2021-08-05 VITALS — Ht 64.0 in | Wt 148.0 lb

## 2021-08-05 DIAGNOSIS — Z86010 Personal history of colonic polyps: Secondary | ICD-10-CM

## 2021-08-05 MED ORDER — SUTAB 1479-225-188 MG PO TABS: 12.0000 | ORAL_TABLET | ORAL | 0 refills | Status: DC

## 2021-08-05 NOTE — Progress Notes (Signed)
No allergies to soy or egg Pt is not on blood thinners or diet pills Denies issues with sedation/intubation Denies atrial flutter/fib Denies constipation   Emmi instructions given to pt  Pt is aware of Covid safety and care partner requirements.   After looking at the prep options and seeing the Sutab pills-with understanding of how they are to be taken, pt picked the sutab pills.  Husband is present during PV

## 2021-08-19 ENCOUNTER — Ambulatory Visit (AMBULATORY_SURGERY_CENTER): Payer: Medicare Other | Admitting: Internal Medicine

## 2021-08-19 ENCOUNTER — Encounter: Payer: Self-pay | Admitting: Internal Medicine

## 2021-08-19 ENCOUNTER — Other Ambulatory Visit: Payer: Self-pay

## 2021-08-19 VITALS — BP 124/53 | HR 73 | Temp 99.1°F | Resp 14 | Ht 64.0 in | Wt 148.0 lb

## 2021-08-19 DIAGNOSIS — D120 Benign neoplasm of cecum: Secondary | ICD-10-CM | POA: Diagnosis not present

## 2021-08-19 DIAGNOSIS — K635 Polyp of colon: Secondary | ICD-10-CM | POA: Diagnosis not present

## 2021-08-19 DIAGNOSIS — Z1211 Encounter for screening for malignant neoplasm of colon: Secondary | ICD-10-CM | POA: Diagnosis not present

## 2021-08-19 DIAGNOSIS — I10 Essential (primary) hypertension: Secondary | ICD-10-CM | POA: Diagnosis not present

## 2021-08-19 DIAGNOSIS — D125 Benign neoplasm of sigmoid colon: Secondary | ICD-10-CM

## 2021-08-19 MED ORDER — SODIUM CHLORIDE 0.9 % IV SOLN: 500.0000 mL | Freq: Once | INTRAVENOUS | Status: DC

## 2021-08-19 NOTE — Op Note (Signed)
[place] Patient Name: Abigail Long Procedure Date: 08/19/2021 2:51 PM MRN: [id number] Endoscopist: Docia Chuck. Henrene Pastor , MD Age: 80 Referring MD:  Date of Birth: 1941-07-06 Gender: Female Account #: [id number] Procedure:                Colonoscopy with cold snare polypectomy x 2 Indications:              Screening for colorectal malignant neoplasm.                            Previous examinations 2002, 2012 both negative for                            neoplasia Medicines:                Monitored Anesthesia Care Procedure:                Pre-Anesthesia Assessment:                           - Prior to the procedure, a History and Physical                            was performed, and patient medications and                            allergies were reviewed. The patient's tolerance of                            previous anesthesia was also reviewed. The risks                            and benefits of the procedure and the sedation                            options and risks were discussed with the patient.                            All questions were answered, and informed consent                            was obtained. Prior Anticoagulants: The patient has                            taken no previous anticoagulant or antiplatelet                            agents. ASA Grade Assessment: II - A patient with                            mild systemic disease. After reviewing the risks                            and benefits, the patient was deemed in  satisfactory condition to undergo the procedure.                           After obtaining informed consent, the colonoscope                            was passed under direct vision. Throughout the                            procedure, the patient's blood pressure, pulse, and                            oxygen saturations were monitored continuously. The                            CF [id number] was  introduced through the anus                            and advanced to the the cecum, identified by                            appendiceal orifice and ileocecal valve. The                            ileocecal valve, appendiceal orifice, and rectum                            were photographed. The quality of the bowel                            preparation was excellent. The colonoscopy was                            performed without difficulty. The patient tolerated                            the procedure well. The bowel preparation used was                            SUPREP via split dose instruction. Scope In: 3:01:27 PM Scope Out: 3:21:06 PM Scope Withdrawal Time: 0 hours 11 minutes 51 seconds  Total Procedure Duration: 0 hours 19 minutes 39 seconds  Findings:                 Two polyps were found in the sigmoid colon and                            cecum. The polyps were 1 to 3 mm in size. These                            polyps were removed with a cold snare. Resection                            and retrieval were  complete.                           Multiple diverticula were found in the left colon.                           The exam was otherwise without abnormality on                            direct and retroflexion views. Complications:            No immediate complications. Estimated blood loss:                            None. Estimated Blood Loss:     Estimated blood loss: none. Impression:               - Two 1 to 3 mm polyps in the sigmoid colon and in                            the cecum, removed with a cold snare. Resected and                            retrieved.                           - Diverticulosis in the left colon.                           - The examination was otherwise normal on direct                            and retroflexion views. Recommendation:           - Repeat colonoscopy is not recommended for                            surveillance.                            - Patient has a contact number available for                            emergencies. The signs and symptoms of potential                            delayed complications were discussed with the                            patient. Return to normal activities tomorrow.                            Written discharge instructions were provided to the                            patient.                           -  Resume previous diet.                           - Continue present medications.                           - Await pathology results. Docia Chuck. Henrene Pastor, MD 08/19/2021 3:25:38 PM This report has been signed electronically.

## 2021-08-19 NOTE — Progress Notes (Signed)
PT taken to PACU. Monitors in place. VSS. Report given to RN. 

## 2021-08-19 NOTE — Progress Notes (Signed)
HISTORY OF PRESENT ILLNESS:  Abigail Long is a 80 y.o. female who presents today for routine screening colonoscopy.  Previous examinations 2002, 2012 were both negative for neoplasia.  She has no active GI complaints or active medical problems  REVIEW OF SYSTEMS:  All non-GI ROS negative.  Past Medical History:  Diagnosis Date   Hypertension    Osteoporosis    osteopenia    Past Surgical History:  Procedure Laterality Date   COLONOSCOPY  12/20/2006    Negative,Dr Henrene Pastor   DILATION AND CURETTAGE OF UTERUS     KNEE SURGERY     TONSILLECTOMY     TOOTH EXTRACTION     right side of mouth and implant placed     Social History [name]  reports that she has never smoked. She has never used smokeless tobacco. She reports that she does not drink alcohol and does not use drugs.  family history includes Bipolar disorder in her son; Breast cancer in her maternal aunt; Cancer in her maternal grandfather and paternal grandfather; Heart disease (age of onset: 43) in her father; Hypertension in her mother; Stomach cancer in her paternal grandfather; Stroke (age of onset: 51) in her maternal aunt.  No Known Allergies     PHYSICAL EXAMINATION:  Vital signs: BP (!) 149/73   Pulse (!) 57   Temp 99.1 F (37.3 C)   Ht '5\' 4"'$  (1.626 m)   Wt 148 lb (67.1 kg)   SpO2 96%   BMI 25.40 kg/m  General: Well-developed, well-nourished, no acute distress HEENT: Sclerae are anicteric, conjunctiva pink. Oral mucosa intact Lungs: Clear Heart: Regular Abdomen: soft, nontender, nondistended, no obvious ascites, no peritoneal signs, normal bowel sounds. No organomegaly. Extremities: No edema Psychiatric: alert and oriented x3. Cooperative     ASSESSMENT:  Colon cancer screening   PLAN:   Screening colonoscopy

## 2021-08-19 NOTE — Progress Notes (Signed)
Called to room to assist during endoscopic procedure.  Patient ID and intended procedure confirmed with present staff. Received instructions for my participation in the procedure from the performing physician.  

## 2021-08-19 NOTE — Patient Instructions (Signed)
Resume previous diet and medications. Awaiting pathology results. Repeat colonoscopy not recommended.  YOU HAD AN ENDOSCOPIC PROCEDURE TODAY AT [facility]:   Refer to the procedure report that was given to you for any specific questions about what was found during the examination.  If the procedure report does not answer your questions, please call your gastroenterologist to clarify.  If you requested that your care partner not be given the details of your procedure findings, then the procedure report has been included in a sealed envelope for you to review at your convenience later.  YOU SHOULD EXPECT: Some feelings of bloating in the abdomen. Passage of more gas than usual.  Walking can help get rid of the air that was put into your GI tract during the procedure and reduce the bloating. If you had a lower endoscopy (such as a colonoscopy or flexible sigmoidoscopy) you may notice spotting of blood in your stool or on the toilet paper. If you underwent a bowel prep for your procedure, you may not have a normal bowel movement for a few days.  Please Note:  You might notice some irritation and congestion in your nose or some drainage.  This is from the oxygen used during your procedure.  There is no need for concern and it should clear up in a day or so.  SYMPTOMS TO REPORT IMMEDIATELY:  Following lower endoscopy (colonoscopy or flexible sigmoidoscopy):  Excessive amounts of blood in the stool  Significant tenderness or worsening of abdominal pains  Swelling of the abdomen that is new, acute  Fever of 100F or higher  For urgent or emergent issues, a gastroenterologist can be reached at any hour by calling [phone]. Do not use MyChart messaging for urgent concerns.    DIET:  We do recommend a small meal at first, but then you may proceed to your regular diet.  Drink plenty of fluids but you should avoid alcoholic beverages for 24 hours.  ACTIVITY:  You should plan to take  it easy for the rest of today and you should NOT DRIVE or use heavy machinery until tomorrow (because of the sedation medicines used during the test).    FOLLOW UP: Our staff will call the number listed on your records 48-72 hours following your procedure to check on you and address any questions or concerns that you may have regarding the information given to you following your procedure. If we do not reach you, we will leave a message.  We will attempt to reach you two times.  During this call, we will ask if you have developed any symptoms of COVID 19. If you develop any symptoms (ie: fever, flu-like symptoms, shortness of breath, cough etc.) before then, please call [phone].  If you test positive for Covid 19 in the 2 weeks post procedure, please call and report this information to [place].    If any biopsies were taken you will be contacted by phone or by letter within the next 1-3 weeks.  Please call us at [phone] if you have not heard about the biopsies in 3 weeks.    SIGNATURES/CONFIDENTIALITY: You and/or your care partner have signed paperwork which will be entered into your electronic medical record.  These signatures attest to the fact that that the information above on your After Visit Summary has been reviewed and is understood.  Full responsibility of the confidentiality of this discharge information lies with you and/or your care-partner.

## 2021-08-19 NOTE — Progress Notes (Signed)
Abigail Long - VS  Pt's states no medical or surgical changes since previsit or office visit.

## 2021-08-21 ENCOUNTER — Telehealth: Payer: Self-pay | Admitting: *Deleted

## 2021-08-21 NOTE — Telephone Encounter (Signed)
  Follow up Call-  Call back number 08/19/2021  Post procedure Call Back phone  # [phone] cell  Permission to leave phone message Yes  Some recent data might be hidden     Patient questions:  Do you have a fever, pain , or abdominal swelling? No. Pain Score  0 *  Have you tolerated food without any problems? Yes.    Have you been able to return to your normal activities? Yes.    Do you have any questions about your discharge instructions: Diet   No. Medications  No. Follow up visit  No.  Do you have questions or concerns about your Care? No.  Actions: * If pain score is 4 or above: No action needed, pain <4.

## 2021-08-25 ENCOUNTER — Encounter: Payer: Self-pay | Admitting: Internal Medicine

## 2021-08-27 ENCOUNTER — Encounter: Payer: Self-pay | Admitting: Gastroenterology

## 2021-09-15 ENCOUNTER — Encounter: Payer: Self-pay | Admitting: Obstetrics & Gynecology

## 2021-09-15 ENCOUNTER — Other Ambulatory Visit: Payer: Self-pay

## 2021-09-15 ENCOUNTER — Ambulatory Visit: Payer: Medicare Other | Admitting: Obstetrics & Gynecology

## 2021-09-15 VITALS — BP 120/76

## 2021-09-15 DIAGNOSIS — N8110 Cystocele, unspecified: Secondary | ICD-10-CM | POA: Diagnosis not present

## 2021-09-15 NOTE — Progress Notes (Signed)
    Abigail Long 1941/04/01 [id number]        80 y.o.  G2P2L2  RP: Bladder prolapse with bulge felt at the vulva  HPI: Bladder prolapse with bulge felt at the vulva.  Mild discomfort, but not painful.  Was worried that it was dangerous for her bladder.  No Urinary Incontinence.     OB History  Gravida Para Term Preterm AB Living  2 2       2   SAB IAB Ectopic Multiple Live Births               # Outcome Date GA Lbr Len/2nd Weight Sex Delivery Anes PTL Lv  2 Para           1 Para             Past medical history,surgical history, problem list, medications, allergies, family history and social history were all reviewed and documented in the EPIC chart.   Directed ROS with pertinent positives and negatives documented in the history of present illness/assessment and plan.  Exam:  Vitals:   09/15/21 1543  BP: 120/76   General appearance:  Normal  Gynecologic exam: Vulva normal.  Bimanual exam:  Uterus AV, normal volume, mobile, NT.  No adnexal mass, NT.  Vaginal exam in standing position with Valsalva:  Cystocele grade 4/4 with bulge reaching about 2 cm below the hymen.  No uterine prolapse.  No Rectocele.   Assessment/Plan:  80 y.o. G2P2   1. Baden-Walker grade 4 cystocele Cystocele grade 4/4 mildly symptomatic.  Counseling done on pelvic floor relaxation.  Patient reassured that it is not dangerous for her bladder.  After counseling on management with observation with avoidance of pelvic floor pressure vs Pessary management or Surgical correction.  The decision is made to observe.  Avoid pelvic floor pressure by emptying her bladder before it is too full, treat constipation, treat cough and avoid heavy lifting.  Continue Kegels.  F/U Annual Gyn exam and f/u on Cystocele grade 4/4.  Other orders - BIOTIN PO; Take by mouth. - CALCIUM PO; Take 600 mg by mouth. Takes 2 - Cholecalciferol (VITAMIN D3 PO); Take by mouth.   Princess Bruins MD, 4:03 PM 09/15/2021

## 2021-09-21 NOTE — Progress Notes (Signed)
Virtual Visit via telephone Note  I connected with Abigail Long on 09/21/21 at  3:40 PM EDT by telephone and verified that I am speaking with the correct person using two identifiers.   I discussed the limitations of evaluation and management by telemedicine and the availability of in person appointments. The patient expressed understanding and agreed to proceed.  Present for the visit:  Myself, Abigail Long, Abigail Long.  The patient is currently at home and I am in the office.    No referring provider.    History of Present Illness: This is an acute visit for covid  Her symptoms began Friday.  She tested positive 2 days ago.  Her husband also has COVID.  He was restarted on Paxlovid She states fatigue, mild sinus pressure on the left, sneezing, runny nose, cough that is primarily dry, nausea, mild dizziness and headaches.  She has not had any shortness of breath or fever.  She is anxious to get started on medication.  She did actually take her husband's Paxlovid this morning so is already started treatment.    Review of Systems  Constitutional:  Positive for malaise/fatigue. Negative for fever.  HENT:  Positive for sinus pain (mild left side). Negative for congestion, ear pain and sore throat.        Sneezing, runny nose.  No change in taste or smell   Respiratory:  Positive for cough. Negative for sputum production, shortness of breath and wheezing.   Gastrointestinal:  Positive for nausea. Negative for diarrhea.  Neurological:  Positive for dizziness (mild) and headaches.     Social History   Socioeconomic History   Marital status: Married    Spouse name: Not on file   Number of children: Not on file   Years of education: Not on file   Highest education level: Not on file  Occupational History   Not on file  Tobacco Use   Smoking status: Never   Smokeless tobacco: Never  Vaping Use   Vaping Use: Never used  Substance and Sexual Activity   Alcohol use: No    Drug use: No   Sexual activity: Yes    Partners: Male    Birth control/protection: Post-menopausal    Comment: 1st intercourse- 14, partners- 2, married- 80 yrs   Other Topics Concern   Not on file  Social History Narrative   [facility]   Social Determinants of Radio broadcast assistant Strain: Not on file  Food Insecurity: Not on file  Transportation Needs: Not on file  Physical Activity: Not on file  Stress: Not on file  Social Connections: Not on file     Observations/Objective:    Assessment and Plan:  See Problem List for Assessment and Plan of chronic medical problems.   Follow Up Instructions:    I discussed the assessment and treatment plan with the patient. The patient was provided an opportunity to ask questions and all were answered. The patient agreed with the plan and demonstrated an understanding of the instructions.   The patient was advised to call back or seek an in-person evaluation if the symptoms worsen or if the condition fails to improve as anticipated.  Time spent on telephone call -13 minutes   Abigail Rail, MD

## 2021-09-22 ENCOUNTER — Encounter: Payer: Self-pay | Admitting: Internal Medicine

## 2021-09-22 ENCOUNTER — Telehealth (INDEPENDENT_AMBULATORY_CARE_PROVIDER_SITE_OTHER): Payer: Medicare Other | Admitting: Internal Medicine

## 2021-09-22 DIAGNOSIS — U071 COVID-19: Secondary | ICD-10-CM | POA: Diagnosis not present

## 2021-09-22 MED ORDER — NIRMATRELVIR/RITONAVIR (PAXLOVID)TABLET: 3.0000 | ORAL_TABLET | Freq: Two times a day (BID) | ORAL | 0 refills | Status: AC

## 2021-09-22 NOTE — Assessment & Plan Note (Signed)
Acute Symptoms started about 5 days ago and she tested +2 days ago Symptoms consistent with COVID-symptoms mild in nature.  She is high risk given her age She has already taken 1 dose of Paxlovid-her husband is currently on this so we will continue this Advised Paxlovid twice daily x5 days-GFR has been within normal limits Discussed possible side effects-altered taste, diarrhea are the most common Advised that she can take over-the-counter cold medications for symptom relief Increase rest and fluids Thick discussed quarantine measures She will call with any questions or concerns and will monitor closely for any fevers or shortness of breath

## 2021-11-24 ENCOUNTER — Ambulatory Visit (INDEPENDENT_AMBULATORY_CARE_PROVIDER_SITE_OTHER): Payer: Medicare Other | Admitting: Obstetrics & Gynecology

## 2021-11-24 ENCOUNTER — Other Ambulatory Visit: Payer: Self-pay

## 2021-11-24 ENCOUNTER — Other Ambulatory Visit: Payer: Self-pay | Admitting: Obstetrics & Gynecology

## 2021-11-24 ENCOUNTER — Encounter: Payer: Self-pay | Admitting: Obstetrics & Gynecology

## 2021-11-24 ENCOUNTER — Telehealth: Payer: Self-pay

## 2021-11-24 ENCOUNTER — Ambulatory Visit: Payer: Medicare Other | Admitting: Obstetrics & Gynecology

## 2021-11-24 VITALS — BP 136/78 | HR 67 | Resp 16 | Ht 63.0 in | Wt 154.0 lb

## 2021-11-24 DIAGNOSIS — Z780 Asymptomatic menopausal state: Secondary | ICD-10-CM | POA: Diagnosis not present

## 2021-11-24 DIAGNOSIS — M8589 Other specified disorders of bone density and structure, multiple sites: Secondary | ICD-10-CM

## 2021-11-24 DIAGNOSIS — Z1231 Encounter for screening mammogram for malignant neoplasm of breast: Secondary | ICD-10-CM

## 2021-11-24 DIAGNOSIS — Z01419 Encounter for gynecological examination (general) (routine) without abnormal findings: Secondary | ICD-10-CM | POA: Diagnosis not present

## 2021-11-24 DIAGNOSIS — N8110 Cystocele, unspecified: Secondary | ICD-10-CM | POA: Diagnosis not present

## 2021-11-24 NOTE — Telephone Encounter (Signed)
Pt notified that order was already placed for bone density and she doesn't need an order for a screening mammogram. She just needs to call the breast center of [place] to have them both scheduled. Pt voiced understanding.

## 2021-11-24 NOTE — Progress Notes (Signed)
Abigail Long 09-27-1941 [id number]   History:    80 y.o.  [place] Married.  Great-grand-mother!   RP:  Established patient presenting for annual gyn exam    HPI: Postmenopause, well on no hormone replacement therapy.  No postmenopausal bleeding.  No pelvic pain.  No pain with intercourse, using KY successfully.  Pap Neg 09/2017.  Cystocele grade 4/4, minimally Sxic, no change x visit 08/2021, observing.  Doing Kegels and avoiding pelvic floor pressure.  Urine and bowel movements normal. Breasts normal.  Will schedule screening mammo.  Body mass index decreased to 27.28.  Health labs with family physician.  [facility] 2022.  BD 11/2019 Osteopenia T-Score -2.0, will repeat BD now.   Past medical history,surgical history, family history and social history were all reviewed and documented in the EPIC chart.  Gynecologic History No LMP recorded. Patient is postmenopausal.  Obstetric History OB History  Gravida Para Term Preterm AB Living  2 2       2   SAB IAB Ectopic Multiple Live Births               # Outcome Date GA Lbr Len/2nd Weight Sex Delivery Anes PTL Lv  2 Para           1 Para              ROS: A ROS was performed and pertinent positives and negatives are included in the history.  GENERAL: No fevers or chills. HEENT: No change in vision, no earache, sore throat or sinus congestion. NECK: No pain or stiffness. CARDIOVASCULAR: No chest pain or pressure. No palpitations. PULMONARY: No shortness of breath, cough or wheeze. GASTROINTESTINAL: No abdominal pain, nausea, vomiting or diarrhea, melena or bright red blood per rectum. GENITOURINARY: No urinary frequency, urgency, hesitancy or dysuria. MUSCULOSKELETAL: No joint or muscle pain, no back pain, no recent trauma. DERMATOLOGIC: No rash, no itching, no lesions. ENDOCRINE: No polyuria, polydipsia, no heat or cold intolerance. No recent change in weight. HEMATOLOGICAL: No anemia or easy bruising or bleeding. NEUROLOGIC: No headache,  seizures, numbness, tingling or weakness. PSYCHIATRIC: No depression, no loss of interest in normal activity or change in sleep pattern.     Exam:   BP 136/78   Pulse 67   Resp 16   Ht 5\' 3"  (1.6 m)   Wt 154 lb (69.9 kg)   BMI 27.28 kg/m   Body mass index is 27.28 kg/m.  General appearance : Well developed well nourished female. No acute distress HEENT: Eyes: no retinal hemorrhage or exudates,  Neck supple, trachea midline, no carotid bruits, no thyroidmegaly Lungs: Clear to auscultation, no rhonchi or wheezes, or rib retractions  Heart: Regular rate and rhythm, no murmurs or gallops Breast:Examined in sitting and supine position were symmetrical in appearance, no palpable masses or tenderness,  no skin retraction, no nipple inversion, no nipple discharge, no skin discoloration, no axillary or supraclavicular lymphadenopathy Abdomen: no palpable masses or tenderness, no rebound or guarding Extremities: no edema or skin discoloration or tenderness  Pelvic: Vulva: Normal             Vagina: No gross lesions or discharge.  Cystocele 4/4, stable.  Cervix: No gross lesions or discharge  Uterus  AV, normal size, shape and consistency, non-tender and mobile  Adnexa  Without masses or tenderness  Anus: Normal   Assessment/Plan:  80 y.o. female for annual exam   1. Well female exam with routine gynecological exam Postmenopause, well on no hormone  replacement therapy.  No postmenopausal bleeding.  No pelvic pain.  No pain with intercourse, using KY successfully.  Pap Neg 09/2017.  Cystocele grade 4/4, minimally Sxic, no change x visit 08/2021, observing.  Doing Kegels and avoiding pelvic floor pressure.  Urine and bowel movements normal. Breasts normal.  Will schedule screening mammo.  Body mass index decreased to 27.28.  Health labs with family physician.  [place] 2022.  BD 11/2019 Osteopenia T-Score -2.0, will repeat BD now.  2. Postmenopausal Postmenopause, well on no hormone  replacement therapy.  No postmenopausal bleeding.  No pelvic pain.  - DG Bone Density; Future  3. Osteopenia of multiple sites BD 11/2019 Osteopenia with T-Score -2.0.  [facility] now. - DG Bone Density; Future  4. Baden-Walker grade 4 cystocele Cystocele grade 4/4, minimally Sxic, no change x visit 08/2021, observing.  Doing Kegels and avoiding pelvic floor pressure.  Urine and bowel movements normal.   Other orders - Ascorbic Acid (VITAMIN C PO); Take by mouth. - cetirizine (ZYRTEC) 10 MG tablet; Take 10 mg by mouth daily.   Princess Bruins MD, 8:20 AM 11/24/2021

## 2021-11-24 NOTE — Telephone Encounter (Signed)
-----   Message from Abigail Bruins, MD sent at 11/24/2021  8:49 AM EST ----- Regarding: Schedule BD and screen Mammo Needs to schedule a Bone Density at the [facility] and overdue for screen Mammo.  Please help patient schedule those.

## 2021-11-24 NOTE — Telephone Encounter (Signed)
LM for pt to call back.

## 2021-11-25 NOTE — Telephone Encounter (Signed)
Mammo scheduled for 12/28/21 and Dexa scheduled for 04/21/22.

## 2021-12-28 ENCOUNTER — Ambulatory Visit
Admission: RE | Admit: 2021-12-28 | Discharge: 2021-12-28 | Disposition: A | Discharge status: Home / Self Care | Payer: Medicare Other | Source: Ambulatory Visit | Attending: Obstetrics & Gynecology | Admitting: Obstetrics & Gynecology

## 2021-12-28 DIAGNOSIS — Z1231 Encounter for screening mammogram for malignant neoplasm of breast: Secondary | ICD-10-CM | POA: Diagnosis not present

## 2022-01-28 DIAGNOSIS — N8111 Cystocele, midline: Secondary | ICD-10-CM | POA: Diagnosis not present

## 2022-04-19 DIAGNOSIS — D1801 Hemangioma of skin and subcutaneous tissue: Secondary | ICD-10-CM | POA: Diagnosis not present

## 2022-04-19 DIAGNOSIS — D225 Melanocytic nevi of trunk: Secondary | ICD-10-CM | POA: Diagnosis not present

## 2022-04-19 DIAGNOSIS — L821 Other seborrheic keratosis: Secondary | ICD-10-CM | POA: Diagnosis not present

## 2022-04-19 DIAGNOSIS — C44311 Basal cell carcinoma of skin of nose: Secondary | ICD-10-CM | POA: Diagnosis not present

## 2022-04-19 DIAGNOSIS — L814 Other melanin hyperpigmentation: Secondary | ICD-10-CM | POA: Diagnosis not present

## 2022-04-19 DIAGNOSIS — L720 Epidermal cyst: Secondary | ICD-10-CM | POA: Diagnosis not present

## 2022-04-21 ENCOUNTER — Ambulatory Visit
Admission: RE | Admit: 2022-04-21 | Discharge: 2022-04-21 | Disposition: A | Discharge status: Home / Self Care | Payer: Medicare Other | Source: Ambulatory Visit | Attending: Obstetrics & Gynecology | Admitting: Obstetrics & Gynecology

## 2022-04-21 DIAGNOSIS — M8589 Other specified disorders of bone density and structure, multiple sites: Secondary | ICD-10-CM | POA: Diagnosis not present

## 2022-04-21 DIAGNOSIS — Z780 Asymptomatic menopausal state: Secondary | ICD-10-CM | POA: Diagnosis not present

## 2022-04-27 ENCOUNTER — Ambulatory Visit (INDEPENDENT_AMBULATORY_CARE_PROVIDER_SITE_OTHER): Payer: Medicare Other | Admitting: Internal Medicine

## 2022-04-27 ENCOUNTER — Encounter: Payer: Self-pay | Admitting: Internal Medicine

## 2022-04-27 DIAGNOSIS — R42 Dizziness and giddiness: Secondary | ICD-10-CM | POA: Diagnosis not present

## 2022-04-27 DIAGNOSIS — Z85828 Personal history of other malignant neoplasm of skin: Secondary | ICD-10-CM | POA: Diagnosis not present

## 2022-04-27 MED ORDER — MECLIZINE HCL 25 MG PO TABS: 25.0000 mg | ORAL_TABLET | Freq: Three times a day (TID) | ORAL | 1 refills | Status: DC | PRN

## 2022-04-27 NOTE — Patient Instructions (Addendum)
? ? ? ?Medications changes include :   meclizine 25 mg three times a day as needed for dizziness ? ? ?Your prescription(s) have been sent to your pharmacy.  ? ? ? ?Return if symptoms worsen or fail to improve. ? ? ? ?Benign Positional Vertigo ?Vertigo is the feeling that you or your surroundings are moving when they are not. Benign positional vertigo is the most common form of vertigo. This is usually a harmless condition (benign). This condition is positional. This means that symptoms are triggered by certain movements and positions. ?This condition can be dangerous if it occurs while you are doing something that could cause harm to yourself or others. This includes activities such as driving or operating machinery. ?What are the causes? ?The inner ear has fluid-filled canals that help your brain sense movement and balance. When the fluid moves, the brain receives messages about your body's position. ?With benign positional vertigo, calcium crystals in the inner ear break free and disturb the inner ear area. This causes your brain to receive confusing messages about your body's position. ?What increases the risk? ?You are more likely to develop this condition if: ?You are a woman. ?You are 79 years of age or older. ?You have recently had a head injury. ?You have an inner ear disease. ?What are the signs or symptoms? ?Symptoms of this condition usually happen when you move your head or your eyes in different directions. Symptoms may start suddenly and usually last for less than a minute. They include: ?Loss of balance and falling. ?Feeling like you are spinning or moving. ?Feeling like your surroundings are spinning or moving. ?Nausea and vomiting. ?Blurred vision. ?Dizziness. ?Involuntary eye movement (nystagmus). ?Symptoms can be mild and cause only minor problems, or they can be severe and interfere with daily life. Episodes of benign positional vertigo may return (recur) over time. Symptoms may also improve  over time. ?How is this diagnosed? ?This condition may be diagnosed based on: ?Your medical history. ?A physical exam of the head, neck, and ears. ?Positional tests to check for or stimulate vertigo. You may be asked to turn your head and change positions, such as going from sitting to lying down. A health care provider will watch for symptoms of vertigo. ?You may be referred to a health care provider who specializes in ear, nose, and throat problems (ENT or otolaryngologist) or a provider who specializes in disorders of the nervous system (neurologist). ?How is this treated? ? ?This condition may be treated in a session in which your health care provider moves your head in specific positions to help the displaced crystals in your inner ear move. Treatment for this condition may take several sessions. Surgery may be needed in severe cases, but this is rare. ?In some cases, benign positional vertigo may resolve on its own in 2-4 weeks. ?Follow these instructions at home: ?Safety ?Move slowly. Avoid sudden body or head movements or certain positions, as told by your health care provider. ?Avoid driving or operating machinery until your health care provider says it is safe. ?Avoid doing any tasks that would be dangerous to you or others if vertigo occurs. ?If you have trouble walking or keeping your balance, try using a cane for stability. If you feel dizzy or unstable, sit down right away. ?Return to your normal activities as told by your health care provider. Ask your health care provider what activities are safe for you. ?General instructions ?Take over-the-counter and prescription medicines only as told by your health  care provider. ?Drink enough fluid to keep your urine pale yellow. ?Keep all follow-up visits. This is important. ?Contact a health care provider if: ?You have a fever. ?Your condition gets worse or you develop new symptoms. ?Your family or friends notice any behavioral changes. ?You have nausea or  vomiting that gets worse. ?You have numbness or a prickling and tingling sensation. ?Get help right away if you: ?Have difficulty speaking or moving. ?Are always dizzy or faint. ?Develop severe headaches. ?Have weakness in your legs or arms. ?Have changes in your hearing or vision. ?Develop a stiff neck. ?Develop sensitivity to light. ?These symptoms may represent a serious problem that is an emergency. Do not wait to see if the symptoms will go away. Get medical help right away. Call your local emergency services (911 in the [place] Do not drive yourself to the hospital. ?Summary ?Vertigo is the feeling that you or your surroundings are moving when they are not. Benign positional vertigo is the most common form of vertigo. ?This condition is caused by calcium crystals in the inner ear that become displaced. This causes a disturbance in an area of the inner ear that helps your brain sense movement and balance. ?Symptoms include loss of balance and falling, feeling that you or your surroundings are moving, nausea and vomiting, and blurred vision. ?This condition can be diagnosed based on symptoms, a physical exam, and positional tests. ?Follow safety instructions as told by your health care provider and keep all follow-up visits. This is important. ?This information is not intended to replace advice given to you by your health care provider. Make sure you discuss any questions you have with your health care provider. ?Document Revised: 11/05/2020 Document Reviewed: 11/05/2020 ?Elsevier Patient Education ? [place]. ? ?

## 2022-04-27 NOTE — Assessment & Plan Note (Addendum)
Acute ?Started three days ago ?Likely BPPV - related to head movements and changes in position ?No neurological deficit or concerning neurological symptoms ?No evidence of bacterial infection ?Possible labyrinthitis from viral infection ?Start meclizine 25 mg 3 times daily as needed.  Discussed this may make her slightly drowsy ?Encourage rest, increase fluids ?Discussed that her symptoms should improve over the next few days and if they do not contact me ? ?

## 2022-04-27 NOTE — Progress Notes (Signed)
? ? ?Subjective:  ? ? Patient ID: Abigail Long, female    DOB: 16-Oct-1941, 81 y.o.   MRN: [id number] ? ? ? ? ? ?HPI ?Abigail Long is here for  ?Chief Complaint  ?Patient presents with  ? Dizziness  ?  Dizziness x 3 days  ? ? ?Dizziness x 3 days - she had dizziness a couple of years ago and in April of this year.  In April she bend over to see the clock in her bedroom at night and fell.   ? ?This most recent episode started 3 nights ago.  She got dizzy when she got out of bed to go to the bathroom.  She is at the point where she now has to get up and wait a minute to let the dizziness stop before she walks.  She is having a difficult time making it to the bathroom in time.  The dizziness is a spinning sensation.  She is getting dizzy throughout the day and night.  It is typically associated with head movements or changes in position. ? ? ? ?Medications and allergies reviewed with patient and updated if appropriate. ? ?Current Outpatient Medications on File Prior to Visit  ?Medication Sig Dispense Refill  ? Ascorbic Acid (VITAMIN C PO) Take by mouth.    ? BIOTIN PO Take by mouth.    ? CALCIUM PO Take 600 mg by mouth. Takes 2    ? cetirizine (ZYRTEC) 10 MG tablet Take 10 mg by mouth daily.    ? Cholecalciferol (VITAMIN D3 PO) Take by mouth.    ? D 1000 25 MCG (1000 UT) capsule SMARTSIG:1 By Mouth    ? fluorouracil (EFUDEX) 5 % cream Apply topically.    ? Glucosamine-Chondroit-Vit C-Mn (GLUCOSAMINE 1500 COMPLEX PO) Take by mouth daily. Takes 2    ? Multiple Vitamins-Minerals (CENTRUM SILVER PO) Take by mouth daily.    ? vitamin C (ASCORBIC ACID) 500 MG tablet SMARTSIG:1 By Mouth    ? ?No current facility-administered medications on file prior to visit.  ? ? ?Review of Systems  ?Constitutional:  Negative for fever.  ?HENT:  Positive for ear pain (mild) and sore throat (mild earlier today). Negative for congestion and sinus pain.   ?Eyes:  Positive for visual disturbance (w/ dizziness).  ?Gastrointestinal:  Negative for  nausea.  ?Neurological:  Positive for dizziness and headaches (slight, intermittent). Negative for numbness.  ? ?   ?Objective:  ? ?Vitals:  ? 04/27/22 1512  ?BP: 130/78  ?Pulse: 67  ?Temp: 98 ?F (36.7 ?C)  ?SpO2: 96%  ? ?BP Readings from Last 3 Encounters:  ?04/27/22 130/78  ?11/24/21 136/78  ?09/15/21 120/76  ? ?Wt Readings from Last 3 Encounters:  ?04/27/22 168 lb 3.2 oz (76.3 kg)  ?11/24/21 154 lb (69.9 kg)  ?08/19/21 148 lb (67.1 kg)  ? ?Body mass index is 29.8 kg/m?. ? ?  ?Physical Exam ?Constitutional:   ?   General: She is not in acute distress. ?   Appearance: Normal appearance. She is not ill-appearing.  ?HENT:  ?   Head: Normocephalic and atraumatic.  ?   Right Ear: Tympanic membrane, ear canal and external ear normal.  ?   Left Ear: Tympanic membrane, ear canal and external ear normal.  ?   Mouth/Throat:  ?   Mouth: Mucous membranes are moist.  ?   Pharynx: No oropharyngeal exudate or posterior oropharyngeal erythema.  ?Eyes:  ?   Conjunctiva/sclera: Conjunctivae normal.  ?Cardiovascular:  ?   Rate  and Rhythm: Normal rate and regular rhythm.  ?   Heart sounds: Normal heart sounds. No murmur heard. ?Pulmonary:  ?   Effort: Pulmonary effort is normal. No respiratory distress.  ?   Breath sounds: Normal breath sounds. No wheezing or rales.  ?Musculoskeletal:  ?   Cervical back: Neck supple. No tenderness.  ?   Right lower leg: No edema.  ?   Left lower leg: No edema.  ?Lymphadenopathy:  ?   Cervical: No cervical adenopathy.  ?Skin: ?   General: Skin is warm and dry.  ?   Findings: No rash.  ?Neurological:  ?   Mental Status: She is alert and oriented to person, place, and time. Mental status is at baseline.  ?   Sensory: No sensory deficit.  ?   Motor: No weakness.  ?Psychiatric:     ?   Mood and Affect: Mood normal.     ?   Behavior: Behavior normal.  ? ?   ? ? ? ? ? ?Assessment & Plan:  ? ? ?See Problem List for Assessment and Plan of chronic medical problems.  ? ? ? ? ?

## 2022-05-25 DIAGNOSIS — C44311 Basal cell carcinoma of skin of nose: Secondary | ICD-10-CM | POA: Diagnosis not present

## 2022-05-31 ENCOUNTER — Ambulatory Visit (INDEPENDENT_AMBULATORY_CARE_PROVIDER_SITE_OTHER): Payer: Medicare Other

## 2022-05-31 DIAGNOSIS — Z0000 Encounter for general adult medical examination without abnormal findings: Secondary | ICD-10-CM

## 2022-05-31 NOTE — Patient Instructions (Signed)
Abigail Long , Thank you for taking time to come for your Medicare Wellness Visit. I appreciate your ongoing commitment to your health goals. Please review the following plan we discussed and let me know if I can assist you in the future.   Screening recommendations/referrals: Colonoscopy: no longer required  Mammogram: no longer required  Bone Density: 04/21/2022 Recommended yearly ophthalmology/optometry visit for glaucoma screening and checkup Recommended yearly dental visit for hygiene and checkup  Vaccinations: Influenza vaccine: completed  Pneumococcal vaccine: completed  Tdap vaccine: 05/28/2020 Shingles vaccine: completed     Advanced directives: yes  Conditions/risks identified: none  Next appointment: none    Preventive Care 20 Years and Older, Female Preventive care refers to lifestyle choices and visits with your health care provider that can promote health and wellness. What does preventive care include? A yearly physical exam. This is also called an annual well check. Dental exams once or twice a year. Routine eye exams. Ask your health care provider how often you should have your eyes checked. Personal lifestyle choices, including: Daily care of your teeth and gums. Regular physical activity. Eating a healthy diet. Avoiding tobacco and drug use. Limiting alcohol use. Practicing safe sex. Taking low-dose aspirin every day. Taking vitamin and mineral supplements as recommended by your health care provider. What happens during an annual well check? The services and screenings done by your health care provider during your annual well check will depend on your age, overall health, lifestyle risk factors, and family history of disease. Counseling  Your health care provider may ask you questions about your: Alcohol use. Tobacco use. Drug use. Emotional well-being. Home and relationship well-being. Sexual activity. Eating habits. History of falls. Memory and  ability to understand (cognition). Work and work Statistician. Reproductive health. Screening  You may have the following tests or measurements: Height, weight, and BMI. Blood pressure. Lipid and cholesterol levels. These may be checked every 5 years, or more frequently if you are over 20 years old. Skin check. Lung cancer screening. You may have this screening every year starting at age 50 if you have a 30-pack-year history of smoking and currently smoke or have quit within the past 15 years. Fecal occult blood test (FOBT) of the stool. You may have this test every year starting at age 30. Flexible sigmoidoscopy or colonoscopy. You may have a sigmoidoscopy every 5 years or a colonoscopy every 10 years starting at age 15. Hepatitis C blood test. Hepatitis B blood test. Sexually transmitted disease (STD) testing. Diabetes screening. This is done by checking your blood sugar (glucose) after you have not eaten for a while (fasting). You may have this done every 1-3 years. Bone density scan. This is done to screen for osteoporosis. You may have this done starting at age 60. Mammogram. This may be done every 1-2 years. Talk to your health care provider about how often you should have regular mammograms. Talk with your health care provider about your test results, treatment options, and if necessary, the need for more tests. Vaccines  Your health care provider may recommend certain vaccines, such as: Influenza vaccine. This is recommended every year. Tetanus, diphtheria, and acellular pertussis (Tdap, Td) vaccine. You may need a Td booster every 10 years. Zoster vaccine. You may need this after age 76. Pneumococcal 13-valent conjugate (PCV13) vaccine. One dose is recommended after age 41. Pneumococcal polysaccharide (PPSV23) vaccine. One dose is recommended after age 27. Talk to your health care provider about which screenings and vaccines you need  and how often you need them. This information is  not intended to replace advice given to you by your health care provider. Make sure you discuss any questions you have with your health care provider. Document Released: 01/02/2016 Document Revised: 08/25/2016 Document Reviewed: 10/07/2015 Elsevier Interactive Patient Education  2017 [place] Prevention in the Home Falls can cause injuries. They can happen to people of all ages. There are many things you can do to make your home safe and to help prevent falls. What can I do on the outside of my home? Regularly fix the edges of walkways and driveways and fix any cracks. Remove anything that might make you trip as you walk through a door, such as a raised step or threshold. Trim any bushes or trees on the path to your home. Use bright outdoor lighting. Clear any walking paths of anything that might make someone trip, such as rocks or tools. Regularly check to see if handrails are loose or broken. Make sure that both sides of any steps have handrails. Any raised decks and porches should have guardrails on the edges. Have any leaves, snow, or ice cleared regularly. Use sand or salt on walking paths during winter. Clean up any spills in your garage right away. This includes oil or grease spills. What can I do in the bathroom? Use night lights. Install grab bars by the toilet and in the tub and shower. Do not use towel bars as grab bars. Use non-skid mats or decals in the tub or shower. If you need to sit down in the shower, use a plastic, non-slip stool. Keep the floor dry. Clean up any water that spills on the floor as soon as it happens. Remove soap buildup in the tub or shower regularly. Attach bath mats securely with double-sided non-slip rug tape. Do not have throw rugs and other things on the floor that can make you trip. What can I do in the bedroom? Use night lights. Make sure that you have a light by your bed that is easy to reach. Do not use any sheets or blankets that  are too big for your bed. They should not hang down onto the floor. Have a firm chair that has side arms. You can use this for support while you get dressed. Do not have throw rugs and other things on the floor that can make you trip. What can I do in the kitchen? Clean up any spills right away. Avoid walking on wet floors. Keep items that you use a lot in easy-to-reach places. If you need to reach something above you, use a strong step stool that has a grab bar. Keep electrical cords out of the way. Do not use floor polish or wax that makes floors slippery. If you must use wax, use non-skid floor wax. Do not have throw rugs and other things on the floor that can make you trip. What can I do with my stairs? Do not leave any items on the stairs. Make sure that there are handrails on both sides of the stairs and use them. Fix handrails that are broken or loose. Make sure that handrails are as long as the stairways. Check any carpeting to make sure that it is firmly attached to the stairs. Fix any carpet that is loose or worn. Avoid having throw rugs at the top or bottom of the stairs. If you do have throw rugs, attach them to the floor with carpet tape. Make sure that you  have a light switch at the top of the stairs and the bottom of the stairs. If you do not have them, ask someone to add them for you. What else can I do to help prevent falls? Wear shoes that: Do not have high heels. Have rubber bottoms. Are comfortable and fit you well. Are closed at the toe. Do not wear sandals. If you use a stepladder: Make sure that it is fully opened. Do not climb a closed stepladder. Make sure that both sides of the stepladder are locked into place. Ask someone to hold it for you, if possible. Clearly mark and make sure that you can see: Any grab bars or handrails. First and last steps. Where the edge of each step is. Use tools that help you move around (mobility aids) if they are needed. These  include: Canes. Walkers. Scooters. Crutches. Turn on the lights when you go into a dark area. Replace any light bulbs as soon as they burn out. Set up your furniture so you have a clear path. Avoid moving your furniture around. If any of your floors are uneven, fix them. If there are any pets around you, be aware of where they are. Review your medicines with your doctor. Some medicines can make you feel dizzy. This can increase your chance of falling. Ask your doctor what other things that you can do to help prevent falls. This information is not intended to replace advice given to you by your health care provider. Make sure you discuss any questions you have with your health care provider. Document Released: 10/02/2009 Document Revised: 05/13/2016 Document Reviewed: 01/10/2015 Elsevier Interactive Patient Education  2017 [employer].

## 2022-05-31 NOTE — Progress Notes (Signed)
Subjective:   Abigail Long is a 81 y.o. female who presents for an Initial Medicare Annual Wellness Visit.   I connected with Sharonda Llamas  today by telephone and verified that I am speaking with the correct person using two identifiers. Location patient: home Location provider: work Persons participating in the virtual visit: patient, provider.   I discussed the limitations, risks, security and privacy concerns of performing an evaluation and management service by telephone and the availability of in person appointments. I also discussed with the patient that there may be a patient responsible charge related to this service. The patient expressed understanding and verbally consented to this telephonic visit.    Interactive audio and video telecommunications were attempted between this provider and patient, however failed, due to patient having technical difficulties OR patient did not have access to video capability.  We continued and completed visit with audio only.    Review of Systems     Cardiac Risk Factors include: advanced age (>63mn, >>38women)     Objective:    Today's Vitals   There is no height or weight on file to calculate BMI.     05/31/2022   10:25 AM  Advanced Directives  Does Patient Have a Medical Advance Directive? Yes  Type of [place]  Copy of [place]? No - copy requested    Current Medications (verified) Outpatient Encounter Medications as of 05/31/2022  Medication Sig   Ascorbic Acid (VITAMIN C PO) Take by mouth.   BIOTIN PO Take by mouth.   CALCIUM PO Take 600 mg by mouth. Takes 2   cetirizine (ZYRTEC) 10 MG tablet Take 10 mg by mouth daily.   Cholecalciferol (VITAMIN D3 PO) Take by mouth.   D 1000 25 MCG (1000 UT) capsule SMARTSIG:1 By Mouth   Glucosamine-Chondroit-Vit C-Mn (GLUCOSAMINE 1500 COMPLEX PO) Take by mouth daily. Takes 2   Multiple Vitamins-Minerals  (CENTRUM SILVER PO) Take by mouth daily.   fluorouracil (EFUDEX) 5 % cream Apply topically. (Patient not taking: Reported on 05/31/2022)   meclizine (ANTIVERT) 25 MG tablet Take 1 tablet (25 mg total) by mouth 3 (three) times daily as needed for dizziness. (Patient not taking: Reported on 05/31/2022)   vitamin C (ASCORBIC ACID) 500 MG tablet SMARTSIG:1 By Mouth (Patient not taking: Reported on 05/31/2022)   No facility-administered encounter medications on file as of 05/31/2022.    Allergies (verified) Patient has no known allergies.   History: Past Medical History:  Diagnosis Date   Hypertension    Osteoporosis    osteopenia   Past Surgical History:  Procedure Laterality Date   COLONOSCOPY  12/20/2006    Negative,Dr PHenrene Pastor  DILATION AND CURETTAGE OF UTERUS     KNEE SURGERY     TONSILLECTOMY     TOOTH EXTRACTION     right side of mouth and implant placed    Family History  Problem Relation Age of Onset   Hypertension Mother    Heart disease Father 831      CBAG X 3; pacer   Stroke Maternal Aunt 85   Breast cancer Maternal Aunt    Cancer Maternal Grandfather        renal   Stomach cancer Paternal Grandfather    Cancer Paternal Grandfather        intra- abdominal   Bipolar disorder Son    Diabetes Neg Hx    Hyperlipidemia Neg Hx  Colon cancer Neg Hx    Colon polyps Neg Hx    Rectal cancer Neg Hx    Social History   Socioeconomic History   Marital status: Married    Spouse name: Not on file   Number of children: Not on file   Years of education: Not on file   Highest education level: Not on file  Occupational History   Not on file  Tobacco Use   Smoking status: Never   Smokeless tobacco: Never  Vaping Use   Vaping Use: Never used  Substance and Sexual Activity   Alcohol use: No   Drug use: No   Sexual activity: Yes    Partners: Male    Birth control/protection: Post-menopausal    Comment: 1st intercourse- 22, partners- 2, married- 6 yrs   Other Topics  Concern   Not on file  Social History Narrative   Ex[facility]   Social Determinants of Health   Financial Resource Strain: Low Risk  (05/31/2022)   Overall Financial Resource Strain (CARDIA)    Difficulty of Paying Living Expenses: Not hard at all  Food Insecurity: No Food Insecurity (05/31/2022)   Hunger Vital Sign    Worried About Running Out of Food in the Last Year: Never true    [place] in the Last Year: Never true  Transportation Needs: No Transportation Needs (05/31/2022)   PRAPARE - Hydrologist (Medical): No    Lack of Transportation (Non-Medical): No  Physical Activity: Inactive (05/31/2022)   Exercise Vital Sign    Days of Exercise per Week: 0 days    Minutes of Exercise per Session: 0 min  Stress: No Stress Concern Present (05/31/2022)   [place]    Feeling of Stress : Not at all  Social Connections: [place] (05/31/2022)   Social Connection and Isolation Panel [NHANES]    Frequency of Communication with Friends and Family: Three times a week    Frequency of Social Gatherings with Friends and Family: Three times a week    Attends Religious Services: More than 4 times per year    Active Member of Clubs or Organizations: Yes    Attends Music therapist: More than 4 times per year    Marital Status: Married    Tobacco Counseling Counseling given: Not Answered   Clinical Intake:  Pre-visit preparation completed: Yes  Pain : No/denies pain     Nutritional Risks: None Diabetes: No  How often do you need to have someone help you when you read instructions, pamphlets, or other written materials from your doctor or pharmacy?: 1 - Never What is the last grade level you completed in school?: college  Diabetic?no   Interpreter Needed?: No  Information entered by :: [place] of Daily  Living    05/31/2022   10:29 AM  In your present state of health, do you have any difficulty performing the following activities:  Hearing? 0  Vision? 0  Difficulty concentrating or making decisions? 0  Walking or climbing stairs? 0  Dressing or bathing? 0  Doing errands, shopping? 0  Preparing Food and eating ? N  Using the Toilet? N  In the past six months, have you accidently leaked urine? N  Do you have problems with loss of bowel control? N  Managing your Medications? N  Managing your Finances? N  Housekeeping  or managing your Housekeeping? N    Patient Care Team: Binnie Rail, MD as PCP - General (Internal Medicine)  Indicate any recent Medical Services you may have received from other than [facility] providers in the past year (date may be approximate).     Assessment:   This is a routine wellness examination for Reida.  Hearing/Vision screen Vision Screening - Comments:: Annual eye exams wear glasses/contacts   Dietary issues and exercise activities discussed: Current Exercise Habits: Home exercise routine, Type of exercise: walking, Exercise limited by: orthopedic condition(s)   Goals Addressed   None    Depression Screen    05/31/2022   10:27 AM 05/31/2022   10:24 AM 04/27/2022    3:23 PM 03/18/2021    7:50 AM 10/19/2019    7:57 AM 10/09/2018    8:24 AM 08/29/2017    9:00 AM  PHQ 2/9 Scores  PHQ - 2 Score 0 0 1 0 0 0 2  PHQ- 9 Score      0 2    Fall Risk    05/31/2022   10:26 AM 04/27/2022    3:23 PM 03/18/2021    7:50 AM 10/19/2019    7:57 AM 10/11/2019    9:32 AM  Fall Risk   Falls in the past year? 1 1 0 0 0  Number falls in past yr: 1 0 0 0 0  Injury with Fall? 0 1 0  0  Risk for fall due to :  No Fall Risks No Fall Risks    Follow up Falls evaluation completed Falls evaluation completed Falls evaluation completed      [place]:  Any stairs in or around the home? No  If so, are there any without handrails? No   Home free of loose throw rugs in walkways, pet beds, electrical cords, etc? Yes  Adequate lighting in your home to reduce risk of falls? Yes   ASSISTIVE DEVICES UTILIZED TO PREVENT FALLS:  Life alert? No  Use of a cane, walker or w/c? No  Grab bars in the bathroom? No  Shower chair or bench in shower? No  Elevated toilet seat or a handicapped toilet? No   Cognitive Function: Normal cognitive status assessed by telephone conversation  by this Nurse Health Advisor. No abnormalities found.         Immunizations Immunization History  Administered Date(s) Administered   Fluad Quad(high Dose 65+) 10/11/2019, 09/25/2020   Influenza Whole 11/07/2007   Influenza, High Dose Seasonal PF 11/25/2015, 09/27/2017, 10/09/2018   Influenza-Unspecified 10/20/2013, 09/19/2016   PFIZER Comirnaty(Gray Top)Covid-19 Tri-Sucrose Vaccine 01/26/2020, 02/16/2020, 03/24/2021   PFIZER(Purple Top)SARS-COV-2 Vaccination 01/26/2020, 02/16/2020, 09/15/2020   Pneumococcal Conjugate-13 10/19/2019   Pneumococcal Polysaccharide-23 07/29/2010, 08/29/2017   Td 07/09/2010   Tdap 05/28/2020   Zoster Recombinat (Shingrix) 11/30/2017, 01/31/2018   Zoster, Live 03/08/2008    TDAP status: Up to date  Flu Vaccine status: Up to date  Pneumococcal vaccine status: Up to date  Covid-19 vaccine status: Completed vaccines  Qualifies for Shingles Vaccine? Yes   Zostavax completed Yes   Shingrix Completed?: Yes  Screening Tests Health Maintenance  Topic Date Due   COVID-19 Vaccine (7 - Pfizer series) 07/24/2021   INFLUENZA VACCINE  07/20/2022   DEXA SCAN  04/21/2025   TETANUS/TDAP  05/28/2030   Pneumonia Vaccine 8+ Years old  Completed   Hepatitis C Screening  Completed   Zoster Vaccines- Shingrix  Completed   HPV VACCINES  Aged Out  Health Maintenance  Health Maintenance Due  Topic Date Due   COVID-19 Vaccine (7 - Pfizer series) 07/24/2021    Colorectal cancer screening: No longer required.    Mammogram status: No longer required due to age.  Bone Density status: Completed 04/21/2022. Results reflect: Bone density results: OSTEOPENIA. Repeat every 5 years.  Lung Cancer Screening: (Low Dose CT Chest recommended if Age 53-80 years, 30 pack-year currently smoking OR have quit w/in 15years.) does not qualify.   Lung Cancer Screening Referral: n/a  Additional Screening:  Hepatitis C Screening: does not qualify;   Vision Screening: Recommended annual ophthalmology exams for early detection of glaucoma and other disorders of the eye. Is the patient up to date with their annual eye exam?  Yes  Who is the provider or what is the name of the office in which the patient attends annual eye exams? Dr.Gould  If pt is not established with a provider, would they like to be referred to a provider to establish care? No .   Dental Screening: Recommended annual dental exams for proper oral hygiene  Community Resource Referral / Chronic Care Management: CRR required this visit?  No   CCM required this visit?  No      Plan:     I have personally reviewed and noted the following in the patient's chart:   Medical and social history Use of alcohol, tobacco or illicit drugs  Current medications and supplements including opioid prescriptions. Patient is not currently taking opioid prescriptions. Functional ability and status Nutritional status Physical activity Advanced directives List of other physicians Hospitalizations, surgeries, and ER visits in previous 12 months Vitals Screenings to include cognitive, depression, and falls Referrals and appointments  In addition, I have reviewed and discussed with patient certain preventive protocols, quality metrics, and best practice recommendations. A written personalized care plan for preventive services as well as general preventive health recommendations were provided to patient.     Randel Pigg, LPN   [id number]   Nurse Notes: none

## 2022-06-10 ENCOUNTER — Encounter: Payer: Self-pay | Admitting: Internal Medicine

## 2022-06-10 NOTE — Progress Notes (Unsigned)
Subjective:    Patient ID: Abigail Long, female    DOB: 1941/09/25, 81 y.o.   MRN: [id number]      HPI Stephnie is here for a Physical exam.      Medications and allergies reviewed with patient and updated if appropriate.  Current Outpatient Medications on File Prior to Visit  Medication Sig Dispense Refill   Ascorbic Acid (VITAMIN C PO) Take by mouth.     BIOTIN PO Take by mouth.     CALCIUM PO Take 600 mg by mouth. Takes 2     cetirizine (ZYRTEC) 10 MG tablet Take 10 mg by mouth daily.     Cholecalciferol (VITAMIN D3 PO) Take by mouth.     D 1000 25 MCG (1000 UT) capsule SMARTSIG:1 By Mouth     fluorouracil (EFUDEX) 5 % cream Apply topically. (Patient not taking: Reported on 05/31/2022)     Glucosamine-Chondroit-Vit C-Mn (GLUCOSAMINE 1500 COMPLEX PO) Take by mouth daily. Takes 2     meclizine (ANTIVERT) 25 MG tablet Take 1 tablet (25 mg total) by mouth 3 (three) times daily as needed for dizziness. (Patient not taking: Reported on 05/31/2022) 30 tablet 1   Multiple Vitamins-Minerals (CENTRUM SILVER PO) Take by mouth daily.     vitamin C (ASCORBIC ACID) 500 MG tablet SMARTSIG:1 By Mouth (Patient not taking: Reported on 05/31/2022)     No current facility-administered medications on file prior to visit.    Review of Systems     Objective:  There were no vitals filed for this visit. There were no vitals filed for this visit. There is no height or weight on file to calculate BMI.  BP Readings from Last 3 Encounters:  04/27/22 130/78  11/24/21 136/78  09/15/21 120/76    Wt Readings from Last 3 Encounters:  04/27/22 168 lb 3.2 oz (76.3 kg)  11/24/21 154 lb (69.9 kg)  08/19/21 148 lb (67.1 kg)       Physical Exam Constitutional: She appears well-developed and well-nourished. No distress.  HENT:  Head: Normocephalic and atraumatic.  Right Ear: External ear normal. Normal ear canal and TM Left Ear: External ear normal.  Normal ear canal and TM Mouth/Throat:  Oropharynx is clear and moist.  Eyes: Conjunctivae normal.  Neck: Neck supple. No tracheal deviation present. No thyromegaly present.  No carotid bruit  Cardiovascular: Normal rate, regular rhythm and normal heart sounds.   No murmur heard.  No edema. Pulmonary/Chest: Effort normal and breath sounds normal. No respiratory distress. She has no wheezes. She has no rales.  Breast: deferred   Abdominal: Soft. She exhibits no distension. There is no tenderness.  Lymphadenopathy: She has no cervical adenopathy.  Skin: Skin is warm and dry. She is not diaphoretic.  Psychiatric: She has a normal mood and affect. Her behavior is normal.     Lab Results  Component Value Date   WBC 4.9 03/18/2021   HGB 14.6 03/18/2021   HCT 43.7 03/18/2021   PLT 293.0 03/18/2021   GLUCOSE 90 03/18/2021   CHOL 140 03/18/2021   TRIG 53.0 03/18/2021   HDL 62.10 03/18/2021   LDLDIRECT 145.8 07/24/2013   LDLCALC 68 03/18/2021   ALT 22 03/18/2021   AST 25 03/18/2021   NA 139 03/18/2021   K 4.6 03/18/2021   CL 102 03/18/2021   CREATININE 0.72 03/18/2021   BUN 21 03/18/2021   CO2 29 03/18/2021   TSH 2.92 10/19/2019   INR 1.06 07/22/2011   HGBA1C 5.7 03/18/2021  Assessment & Plan:   Physical exam: Screening blood work  ordered Exercise   Weight   Substance abuse  none   Reviewed recommended immunizations.   Health Maintenance  Topic Date Due   COVID-19 Vaccine (7 - Pfizer series) 07/24/2021   INFLUENZA VACCINE  07/20/2022   DEXA SCAN  04/21/2025   TETANUS/TDAP  05/28/2030   Pneumonia Vaccine 9+ Years old  Completed   Hepatitis C Screening  Completed   Zoster Vaccines- Shingrix  Completed   HPV VACCINES  Aged Out          See Problem List for Assessment and Plan of chronic medical problems.

## 2022-06-10 NOTE — Patient Instructions (Signed)
   Blood work was ordered.     Medications changes include :      Your prescription(s) have been sent to your pharmacy.    A referral was ordered for XX.     Someone from that office will call you to schedule an appointment.    No follow-ups on file.   Health Maintenance, Female Adopting a healthy lifestyle and getting preventive care are important in promoting health and wellness. Ask your health care provider about: The right schedule for you to have regular tests and exams. Things you can do on your own to prevent diseases and keep yourself healthy. What should I know about diet, weight, and exercise? Eat a healthy diet  Eat a diet that includes plenty of vegetables, fruits, low-fat dairy products, and lean protein. Do not eat a lot of foods that are high in solid fats, added sugars, or sodium. Maintain a healthy weight Body mass index (BMI) is used to identify weight problems. It estimates body fat based on height and weight. Your health care provider can help determine your BMI and help you achieve or maintain a healthy weight. Get regular exercise Get regular exercise. This is one of the most important things you can do for your health. Most adults should: Exercise for at least 150 minutes each week. The exercise should increase your heart rate and make you sweat (moderate-intensity exercise). Do strengthening exercises at least twice a week. This is in addition to the moderate-intensity exercise. Spend less time sitting. Even light physical activity can be beneficial. Watch cholesterol and blood lipids Have your blood tested for lipids and cholesterol at 79 years of age, then have this test every 5 years. Have your cholesterol levels checked more often if: Your lipid or cholesterol levels are high. You are older than 79 years of age. You are at high risk for heart disease. What should I know about cancer screening? Depending on your health history and family history,  you may need to have cancer screening at various ages. This may include screening for: Breast cancer. Cervical cancer. Colorectal cancer. Skin cancer. Lung cancer. What should I know about heart disease, diabetes, and high blood pressure? Blood pressure and heart disease High blood pressure causes heart disease and increases the risk of stroke. This is more likely to develop in people who have high blood pressure readings or are overweight. Have your blood pressure checked: Every 3-5 years if you are 18-39 years of age. Every year if you are 40 years old or older. Diabetes Have regular diabetes screenings. This checks your fasting blood sugar level. Have the screening done: Once every three years after age 40 if you are at a normal weight and have a low risk for diabetes. More often and at a younger age if you are overweight or have a high risk for diabetes. What should I know about preventing infection? Hepatitis B If you have a higher risk for hepatitis B, you should be screened for this virus. Talk with your health care provider to find out if you are at risk for hepatitis B infection. Hepatitis C Testing is recommended for: Everyone born from 1945 through 1965. Anyone with known risk factors for hepatitis C. Sexually transmitted infections (STIs) Get screened for STIs, including gonorrhea and chlamydia, if: You are sexually active and are younger than 79 years of age. You are older than 79 years of age and your health care provider tells you that you are at risk for   this type of infection. Your sexual activity has changed since you were last screened, and you are at increased risk for chlamydia or gonorrhea. Ask your health care provider if you are at risk. Ask your health care provider about whether you are at high risk for HIV. Your health care provider may recommend a prescription medicine to help prevent HIV infection. If you choose to take medicine to prevent HIV, you should  first get tested for HIV. You should then be tested every 3 months for as long as you are taking the medicine. Pregnancy If you are about to stop having your period (premenopausal) and you may become pregnant, seek counseling before you get pregnant. Take 400 to 800 micrograms (mcg) of folic acid every day if you become pregnant. Ask for birth control (contraception) if you want to prevent pregnancy. Osteoporosis and menopause Osteoporosis is a disease in which the bones lose minerals and strength with aging. This can result in bone fractures. If you are 65 years old or older, or if you are at risk for osteoporosis and fractures, ask your health care provider if you should: Be screened for bone loss. Take a calcium or vitamin D supplement to lower your risk of fractures. Be given hormone replacement therapy (HRT) to treat symptoms of menopause. Follow these instructions at home: Alcohol use Do not drink alcohol if: Your health care provider tells you not to drink. You are pregnant, may be pregnant, or are planning to become pregnant. If you drink alcohol: Limit how much you have to: 0-1 drink a day. Know how much alcohol is in your drink. In the [place] one drink equals one 12 oz bottle of beer (355 mL), one 5 oz glass of wine (148 mL), or one 1 oz glass of hard liquor (44 mL). Lifestyle Do not use any products that contain nicotine or tobacco. These products include cigarettes, chewing tobacco, and vaping devices, such as e-cigarettes. If you need help quitting, ask your health care provider. Do not use street drugs. Do not share needles. Ask your health care provider for help if you need support or information about quitting drugs. General instructions Schedule regular health, dental, and eye exams. Stay current with your vaccines. Tell your health care provider if: You often feel depressed. You have ever been abused or do not feel safe at home. Summary Adopting a healthy lifestyle  and getting preventive care are important in promoting health and wellness. Follow your health care provider's instructions about healthy diet, exercising, and getting tested or screened for diseases. Follow your health care provider's instructions on monitoring your cholesterol and blood pressure. This information is not intended to replace advice given to you by your health care provider. Make sure you discuss any questions you have with your health care provider. Document Revised: 04/27/2021 Document Reviewed: 04/27/2021 Elsevier Patient Education  2023 Else[employer] Inc[employer]  

## 2022-06-11 ENCOUNTER — Ambulatory Visit (INDEPENDENT_AMBULATORY_CARE_PROVIDER_SITE_OTHER): Payer: Medicare Other | Admitting: Internal Medicine

## 2022-06-11 VITALS — BP 134/86 | HR 59 | Temp 98.0°F | Ht 63.0 in | Wt 169.2 lb

## 2022-06-11 DIAGNOSIS — M8589 Other specified disorders of bone density and structure, multiple sites: Secondary | ICD-10-CM

## 2022-06-11 DIAGNOSIS — E782 Mixed hyperlipidemia: Secondary | ICD-10-CM

## 2022-06-11 DIAGNOSIS — Z0000 Encounter for general adult medical examination without abnormal findings: Secondary | ICD-10-CM

## 2022-06-11 DIAGNOSIS — I10 Essential (primary) hypertension: Secondary | ICD-10-CM

## 2022-06-11 DIAGNOSIS — R7303 Prediabetes: Secondary | ICD-10-CM | POA: Diagnosis not present

## 2022-06-11 LAB — CBC WITH DIFFERENTIAL/PLATELET
Basophils Absolute: 0 10*3/uL (ref 0.0–0.1)
Basophils Relative: 0.4 % (ref 0.0–3.0)
Eosinophils Absolute: 0.2 10*3/uL (ref 0.0–0.7)
Eosinophils Relative: 4.8 % (ref 0.0–5.0)
HCT: 43.4 % (ref 36.0–46.0)
Hemoglobin: 14.2 g/dL (ref 12.0–15.0)
Lymphocytes Relative: 27.7 % (ref 12.0–46.0)
Lymphs Abs: 1.4 10*3/uL (ref 0.7–4.0)
MCHC: 32.7 g/dL (ref 30.0–36.0)
MCV: 84.9 fl (ref 78.0–100.0)
Monocytes Absolute: 0.6 10*3/uL (ref 0.1–1.0)
Monocytes Relative: 11.7 % (ref 3.0–12.0)
Neutro Abs: 2.8 10*3/uL (ref 1.4–7.7)
Neutrophils Relative %: 55.4 % (ref 43.0–77.0)
Platelets: 283 10*3/uL (ref 150.0–400.0)
RBC: 5.12 Mil/uL — ABNORMAL HIGH (ref 3.87–5.11)
RDW: 14.1 % (ref 11.5–15.5)
WBC: 5 10*3/uL (ref 4.0–10.5)

## 2022-06-11 LAB — COMPREHENSIVE METABOLIC PANEL
ALT: 22 U/L (ref 0–35)
AST: 22 U/L (ref 0–37)
Albumin: 4.4 g/dL (ref 3.5–5.2)
Alkaline Phosphatase: 46 U/L (ref 39–117)
BUN: 19 mg/dL (ref 6–23)
CO2: 34 mEq/L — ABNORMAL HIGH (ref 19–32)
Calcium: 10 mg/dL (ref 8.4–10.5)
Chloride: 99 mEq/L (ref 96–112)
Creatinine, Ser: 0.68 mg/dL (ref 0.40–1.20)
GFR: 82.92 mL/min (ref >60.00)
Glucose, Bld: 94 mg/dL (ref 70–99)
Potassium: 4.7 mEq/L (ref 3.5–5.1)
Sodium: 137 mEq/L (ref 135–145)
Total Bilirubin: 0.4 mg/dL (ref 0.2–1.2)
Total Protein: 7.3 g/dL (ref 6.0–8.3)

## 2022-06-11 LAB — LIPID PANEL
Cholesterol: 192 mg/dL (ref 0–200)
HDL: 66.7 mg/dL (ref >39.00)
LDL Cholesterol: 111 mg/dL — ABNORMAL HIGH (ref 0–99)
NonHDL: 125.01
Total CHOL/HDL Ratio: 3
Triglycerides: 72 mg/dL (ref 0.0–149.0)
VLDL: 14.4 mg/dL (ref 0.0–40.0)

## 2022-06-11 LAB — HEMOGLOBIN A1C: Hgb A1c MFr Bld: 5.7 % (ref 4.6–6.5)

## 2022-06-11 NOTE — Assessment & Plan Note (Signed)
Chronic Check a1c Low sugar / carb diet Stressed regular exercise  

## 2022-07-05 DIAGNOSIS — H5203 Hypermetropia, bilateral: Secondary | ICD-10-CM | POA: Diagnosis not present

## 2022-07-05 DIAGNOSIS — H52203 Unspecified astigmatism, bilateral: Secondary | ICD-10-CM | POA: Diagnosis not present

## 2022-07-05 DIAGNOSIS — H2513 Age-related nuclear cataract, bilateral: Secondary | ICD-10-CM | POA: Diagnosis not present

## 2023-02-16 ENCOUNTER — Encounter: Payer: Self-pay | Admitting: Internal Medicine

## 2023-02-16 NOTE — Progress Notes (Unsigned)
    Subjective:    Patient ID: Abigail Long, female    DOB: 1941-10-16, 82 y.o.   MRN: [id number]      HPI Abigail Long is here for No chief complaint on file.    BP is high -     Medications and allergies reviewed with patient and updated if appropriate.  Current Outpatient Medications on File Prior to Visit  Medication Sig Dispense Refill   Ascorbic Acid (VITAMIN C PO) Take by mouth.     BIOTIN PO Take by mouth.     CALCIUM PO Take 600 mg by mouth. Takes 2     cetirizine (ZYRTEC) 10 MG tablet Take 10 mg by mouth daily.     Cholecalciferol (VITAMIN D3 PO) Take by mouth.     D 1000 25 MCG (1000 UT) capsule SMARTSIG:1 By Mouth     Glucosamine-Chondroit-Vit C-Mn (GLUCOSAMINE 1500 COMPLEX PO) Take by mouth daily. Takes 2     meclizine (ANTIVERT) 25 MG tablet Take 1 tablet (25 mg total) by mouth 3 (three) times daily as needed for dizziness. 30 tablet 1   Multiple Vitamins-Minerals (CENTRUM SILVER PO) Take by mouth daily.     No current facility-administered medications on file prior to visit.    Review of Systems     Objective:  There were no vitals filed for this visit. BP Readings from Last 3 Encounters:  06/11/22 134/86  04/27/22 130/78  11/24/21 136/78   Wt Readings from Last 3 Encounters:  06/11/22 169 lb 4 oz (76.8 kg)  04/27/22 168 lb 3.2 oz (76.3 kg)  11/24/21 154 lb (69.9 kg)   There is no height or weight on file to calculate BMI.    Physical Exam         Assessment & Plan:    See Problem List for Assessment and Plan of chronic medical problems.

## 2023-02-17 ENCOUNTER — Ambulatory Visit (INDEPENDENT_AMBULATORY_CARE_PROVIDER_SITE_OTHER): Payer: Medicare Other | Admitting: Internal Medicine

## 2023-02-17 VITALS — BP 140/80 | HR 63 | Temp 98.0°F | Ht 63.0 in | Wt 189.0 lb

## 2023-02-17 DIAGNOSIS — I10 Essential (primary) hypertension: Secondary | ICD-10-CM | POA: Diagnosis not present

## 2023-02-17 MED ORDER — AMLODIPINE BESYLATE 2.5 MG PO TABS: 2.5000 mg | ORAL_TABLET | Freq: Every day | ORAL | 2 refills | Status: DC

## 2023-02-17 NOTE — Assessment & Plan Note (Signed)
H/o of htn, BP elevated again  Restart amlodipine 2.5 mg daily Stressed low sodium diet, regular exercise and weight loss Monitor BP at home - ? Need a higher dose or not  -- discussed goal and she will let me know if BP is not controlled at home Follow up in June as scheduled  - sooner if needed

## 2023-02-17 NOTE — Patient Instructions (Addendum)
          Medications changes include :   start amlodipine 2.5 mg daily      Return for follow up as scheduled.

## 2023-06-02 ENCOUNTER — Ambulatory Visit (INDEPENDENT_AMBULATORY_CARE_PROVIDER_SITE_OTHER): Payer: Medicare Other

## 2023-06-02 VITALS — Ht 63.0 in | Wt 195.0 lb

## 2023-06-02 DIAGNOSIS — Z0000 Encounter for general adult medical examination without abnormal findings: Secondary | ICD-10-CM

## 2023-06-02 NOTE — Patient Instructions (Addendum)
Abigail Long , Thank you for taking time to come for your Medicare Wellness Visit. I appreciate your ongoing commitment to your health goals. Please review the following plan we discussed and let me know if I can assist you in the future.   These are the goals we discussed:  Goals      My goal for 2024 is to continue to lose weight and weight goal 150 pounds.        This is a list of the screening recommended for you and due dates:  Health Maintenance  Topic Date Due   COVID-19 Vaccine (7 - 2023-24 season) 08/20/2022   Flu Shot  07/21/2023   Medicare Annual Wellness Visit  06/01/2024   DEXA scan (bone density measurement)  04/21/2025   DTaP/Tdap/Td vaccine (4 - Td or Tdap) 05/28/2030   Pneumonia Vaccine  Completed   Zoster (Shingles) Vaccine  Completed   HPV Vaccine  Aged Out   Hepatitis C Screening  Discontinued    Advanced directives: Yes  Conditions/risks identified: Yes  Next appointment: Follow up in one year for your annual wellness visit via telephone call with Nurse Percell Miller on 06/06/2024 at 10:30 a.m.  If you need to cancel or reschedule please call [phone].   Preventive Care 19 Years and Older, Female Preventive care refers to lifestyle choices and visits with your health care provider that can promote health and wellness. What does preventive care include? A yearly physical exam. This is also called an annual well check. Dental exams once or twice a year. Routine eye exams. Ask your health care provider how often you should have your eyes checked. Personal lifestyle choices, including: Daily care of your teeth and gums. Regular physical activity. Eating a healthy diet. Avoiding tobacco and drug use. Limiting alcohol use. Practicing safe sex. Taking low-dose aspirin every day. Taking vitamin and mineral supplements as recommended by your health care provider. What happens during an annual well check? The services and screenings done by your health care  provider during your annual well check will depend on your age, overall health, lifestyle risk factors, and family history of disease. Counseling  Your health care provider may ask you questions about your: Alcohol use. Tobacco use. Drug use. Emotional well-being. Home and relationship well-being. Sexual activity. Eating habits. History of falls. Memory and ability to understand (cognition). Work and work Astronomer. Reproductive health. Screening  You may have the following tests or measurements: Height, weight, and BMI. Blood pressure. Lipid and cholesterol levels. These may be checked every 5 years, or more frequently if you are over 55 years old. Skin check. Lung cancer screening. You may have this screening every year starting at age 89 if you have a 30-pack-year history of smoking and currently smoke or have quit within the past 15 years. Fecal occult blood test (FOBT) of the stool. You may have this test every year starting at age 4. Flexible sigmoidoscopy or colonoscopy. You may have a sigmoidoscopy every 5 years or a colonoscopy every 10 years starting at age 72. Hepatitis C blood test. Hepatitis B blood test. Sexually transmitted disease (STD) testing. Diabetes screening. This is done by checking your blood sugar (glucose) after you have not eaten for a while (fasting). You may have this done every 1-3 years. Bone density scan. This is done to screen for osteoporosis. You may have this done starting at age 55. Mammogram. This may be done every 1-2 years. Talk to your health care provider about how often  you should have regular mammograms. Talk with your health care provider about your test results, treatment options, and if necessary, the need for more tests. Vaccines  Your health care provider may recommend certain vaccines, such as: Influenza vaccine. This is recommended every year. Tetanus, diphtheria, and acellular pertussis (Tdap, Td) vaccine. You may need a Td  booster every 10 years. Zoster vaccine. You may need this after age 23. Pneumococcal 13-valent conjugate (PCV13) vaccine. One dose is recommended after age 6. Pneumococcal polysaccharide (PPSV23) vaccine. One dose is recommended after age 77. Talk to your health care provider about which screenings and vaccines you need and how often you need them. This information is not intended to replace advice given to you by your health care provider. Make sure you discuss any questions you have with your health care provider. Document Released: 01/02/2016 Document Revised: 08/25/2016 Document Reviewed: 10/07/2015 Elsevier Interactive Patient Education  2017 [employer].  Fall Prevention in the Home Falls can cause injuries. They can happen to people of all ages. There are many things you can do to make your home safe and to help prevent falls. What can I do on the outside of my home? Regularly fix the edges of walkways and driveways and fix any cracks. Remove anything that might make you trip as you walk through a door, such as a raised step or threshold. Trim any bushes or trees on the path to your home. Use bright outdoor lighting. Clear any walking paths of anything that might make someone trip, such as rocks or tools. Regularly check to see if handrails are loose or broken. Make sure that both sides of any steps have handrails. Any raised decks and porches should have guardrails on the edges. Have any leaves, snow, or ice cleared regularly. Use sand or salt on walking paths during winter. Clean up any spills in your garage right away. This includes oil or grease spills. What can I do in the bathroom? Use night lights. Install grab bars by the toilet and in the tub and shower. Do not use towel bars as grab bars. Use non-skid mats or decals in the tub or shower. If you need to sit down in the shower, use a plastic, non-slip stool. Keep the floor dry. Clean up any water that spills on the floor  as soon as it happens. Remove soap buildup in the tub or shower regularly. Attach bath mats securely with double-sided non-slip rug tape. Do not have throw rugs and other things on the floor that can make you trip. What can I do in the bedroom? Use night lights. Make sure that you have a light by your bed that is easy to reach. Do not use any sheets or blankets that are too big for your bed. They should not hang down onto the floor. Have a firm chair that has side arms. You can use this for support while you get dressed. Do not have throw rugs and other things on the floor that can make you trip. What can I do in the kitchen? Clean up any spills right away. Avoid walking on wet floors. Keep items that you use a lot in easy-to-reach places. If you need to reach something above you, use a strong step stool that has a grab bar. Keep electrical cords out of the way. Do not use floor polish or wax that makes floors slippery. If you must use wax, use non-skid floor wax. Do not have throw rugs and other things on  the floor that can make you trip. What can I do with my stairs? Do not leave any items on the stairs. Make sure that there are handrails on both sides of the stairs and use them. Fix handrails that are broken or loose. Make sure that handrails are as long as the stairways. Check any carpeting to make sure that it is firmly attached to the stairs. Fix any carpet that is loose or worn. Avoid having throw rugs at the top or bottom of the stairs. If you do have throw rugs, attach them to the floor with carpet tape. Make sure that you have a light switch at the top of the stairs and the bottom of the stairs. If you do not have them, ask someone to add them for you. What else can I do to help prevent falls? Wear shoes that: Do not have high heels. Have rubber bottoms. Are comfortable and fit you well. Are closed at the toe. Do not wear sandals. If you use a stepladder: Make sure that it is  fully opened. Do not climb a closed stepladder. Make sure that both sides of the stepladder are locked into place. Ask someone to hold it for you, if possible. Clearly mark and make sure that you can see: Any grab bars or handrails. First and last steps. Where the edge of each step is. Use tools that help you move around (mobility aids) if they are needed. These include: Canes. Walkers. Scooters. Crutches. Turn on the lights when you go into a dark area. Replace any light bulbs as soon as they burn out. Set up your furniture so you have a clear path. Avoid moving your furniture around. If any of your floors are uneven, fix them. If there are any pets around you, be aware of where they are. Review your medicines with your doctor. Some medicines can make you feel dizzy. This can increase your chance of falling. Ask your doctor what other things that you can do to help prevent falls. This information is not intended to replace advice given to you by your health care provider. Make sure you discuss any questions you have with your health care provider. Document Released: 10/02/2009 Document Revised: 05/13/2016 Document Reviewed: 01/10/2015 Elsevier Interactive Patient Education  2017 [employer].

## 2023-06-02 NOTE — Progress Notes (Signed)
I connected with  Abigail Long on 06/02/23 by a audio enabled telemedicine application and verified that I am speaking with the correct person using two identifiers.  Patient Location: Home  Provider Location: Office/Clinic  I discussed the limitations of evaluation and management by telemedicine. The patient expressed understanding and agreed to proceed. . Subjective:   Abigail Long is a 82 y.o. female who presents for Medicare Annual (Subsequent) preventive examination.  Review of Systems     Cardiac Risk Factors include: advanced age (>32men, >79 women);dyslipidemia;hypertension;obesity (BMI >30kg/m2)     Objective:    Today's Vitals   06/02/23 1031  Weight: 195 lb (88.5 kg)  Height: 5\' 3"  (1.6 m)  PainSc: 0-No pain   Body mass index is 34.54 kg/m.     06/02/2023   10:33 AM 05/31/2022   10:25 AM  Advanced Directives  Does Patient Have a Medical Advance Directive? Yes Yes  Type of Estate agent of [place];Living will Healthcare Power of [place];Living will  Copy of Healthcare Power of Attorney in Chart? No - copy requested No - copy requested    Current Medications (verified) Outpatient Encounter Medications as of 06/02/2023  Medication Sig   amLODipine (NORVASC) 2.5 MG tablet Take 1 tablet (2.5 mg total) by mouth daily.   Ascorbic Acid (VITAMIN C PO) Take by mouth.   BIOTIN PO Take by mouth.   CALCIUM PO Take 600 mg by mouth. Takes 2   cetirizine (ZYRTEC) 10 MG tablet Take 10 mg by mouth daily. (Patient not taking: Reported on 02/17/2023)   Cholecalciferol (VITAMIN D3 PO) Take by mouth.   D 1000 25 MCG (1000 UT) capsule SMARTSIG:1 By Mouth   Glucosamine-Chondroit-Vit C-Mn (GLUCOSAMINE 1500 COMPLEX PO) Take by mouth daily. Takes 2   meclizine (ANTIVERT) 25 MG tablet Take 1 tablet (25 mg total) by mouth 3 (three) times daily as needed for dizziness.   Multiple Vitamins-Minerals (CENTRUM SILVER PO) Take by mouth daily.   No  facility-administered encounter medications on file as of 06/02/2023.    Allergies (verified) Patient has no known allergies.   History: Past Medical History:  Diagnosis Date   Hypertension    Osteoporosis    osteopenia   Past Surgical History:  Procedure Laterality Date   COLONOSCOPY  12/20/2006    Negative,Dr Marina Goodell   DILATION AND CURETTAGE OF UTERUS     KNEE SURGERY     TONSILLECTOMY     TOOTH EXTRACTION     right side of mouth and implant placed    Family History  Problem Relation Age of Onset   Hypertension Mother    Heart disease Father 5       CBAG X 3; pacer   Stroke Maternal Aunt 85   Breast cancer Maternal Aunt    Cancer Maternal Grandfather        renal   Stomach cancer Paternal Grandfather    Cancer Paternal Grandfather        intra- abdominal   Bipolar disorder Son    Diabetes Neg Hx    Hyperlipidemia Neg Hx    Colon cancer Neg Hx    Colon polyps Neg Hx    Rectal cancer Neg Hx    Social History   Socioeconomic History   Marital status: Married    Spouse name: Not on file   Number of children: Not on file   Years of education: Not on file   Highest education level: Not on file  Occupational History  Not on file  Tobacco Use   Smoking status: Never   Smokeless tobacco: Never  Vaping Use   Vaping Use: Never used  Substance and Sexual Activity   Alcohol use: No   Drug use: No   Sexual activity: Yes    Partners: Male    Birth control/protection: Post-menopausal    Comment: 1st intercourse- 65, partners- 2, married- 48 yrs   Other Topics Concern   Not on file  Social History Narrative   [facility]   Social Determinants of Health   Financial Resource Strain: Low Risk  (06/02/2023)   Overall Financial Resource Strain (CARDIA)    Difficulty of Paying Living Expenses: Not hard at all  Food Insecurity: No Food Insecurity (06/02/2023)   Hunger Vital Sign    Worried About Running Out of Food in the Last Year:  Never true    Ran Out of Food in the Last Year: Never true  Transportation Needs: No Transportation Needs (06/02/2023)   PRAPARE - Administrator, Civil Service (Medical): No    Lack of Transportation (Non-Medical): No  Physical Activity: Sufficiently Active (06/02/2023)   Exercise Vital Sign    Days of Exercise per Week: 5 days    Minutes of Exercise per Session: 30 min  Stress: No Stress Concern Present (06/02/2023)   [employer] of Occupational Health - Occupational Stress Questionnaire    Feeling of Stress : Not at all  Social Connections: Socially Integrated (06/02/2023)   Social Connection and Isolation Panel [NHANES]    Frequency of Communication with Friends and Family: Three times a week    Frequency of Social Gatherings with Friends and Family: Three times a week    Attends Religious Services: More than 4 times per year    Active Member of Clubs or Organizations: Yes    Attends Engineer, structural: More than 4 times per year    Marital Status: Married    Tobacco Counseling Counseling given: Not Answered   Clinical Intake:  Pre-visit preparation completed: Yes  Pain : No/denies pain Pain Score: 0-No pain     BMI - recorded: 34.54 Nutritional Status: BMI > 30  Obese Nutritional Risks: None Diabetes: No  How often do you need to have someone help you when you read instructions, pamphlets, or other written materials from your doctor or pharmacy?: 1 - Never What is the last grade level you completed in school?: HSG  Diabetic?No  Interpreter Needed?: No  Information entered by :: Junior Huezo N. Nekisha Mcdiarmid, LPN.   Activities of Daily Living    06/02/2023   10:43 AM  In your present state of health, do you have any difficulty performing the following activities:  Hearing? 0  Vision? 0  Difficulty concentrating or making decisions? 0  Walking or climbing stairs? 0  Dressing or bathing? 0  Doing errands, shopping? 0  Preparing Food and  eating ? N  Using the Toilet? N  In the past six months, have you accidently leaked urine? N  Do you have problems with loss of bowel control? N  Managing your Medications? N  Managing your Finances? N  Housekeeping or managing your Housekeeping? N    Patient Care Team: Pincus Sanes, MD as PCP - General (Internal Medicine) Manning Charity, OD as Referring Physician (Optometry)  Indicate any recent Medical Services you may have received from other than [facility] providers in the past year (date may be approximate).  Assessment:   This is a routine wellness examination for K[place].  Hearing/Vision screen Hearing Screening - Comments:: Denies hearing difficulties   Vision Screening - Comments:: Wears rx glasses - up to date with routine eye exams with Manning Charity, OD.   Dietary issues and exercise activities discussed: Current Exercise Habits: Home exercise routine;Structured exercise class, Type of exercise: walking;strength training/weights;stretching;treadmill, Time (Minutes): 30, Frequency (Times/Week): 5, Weekly Exercise (Minutes/Week): 150, Intensity: Moderate, Exercise limited by: None identified   Goals Addressed             This Visit's Progress    My goal for 2024 is to continue to lose weight and weight goal 150 pounds.        Depression Screen    06/02/2023   10:40 AM 02/17/2023    7:59 AM 06/11/2022   10:03 AM 05/31/2022   10:27 AM 05/31/2022   10:24 AM 04/27/2022    3:23 PM 03/18/2021    7:50 AM  PHQ 2/9 Scores  PHQ - 2 Score 0 0 0 0 0 1 0  PHQ- 9 Score 0 3         Fall Risk    06/02/2023   10:40 AM 02/17/2023    7:59 AM 06/11/2022   10:03 AM 05/31/2022   10:26 AM 04/27/2022    3:23 PM  Fall Risk   Falls in the past year? 0 0 1 1 1   Number falls in past yr: 0 0 1 1 0  Injury with Fall? 0 0 0 0 1  Risk for fall due to : No Fall Risks No Fall Risks   No Fall Risks  Follow up Falls prevention discussed Falls evaluation completed  Falls evaluation completed Falls  evaluation completed    FALL RISK PREVENTION PERTAINING TO THE HOME:  Any stairs in or around the home? No  If so, are there any without handrails? No  Home free of loose throw rugs in walkways, pet beds, electrical cords, etc? Yes  Adequate lighting in your home to reduce risk of falls? Yes   ASSISTIVE DEVICES UTILIZED TO PREVENT FALLS:  Life alert? No  Use of a cane, walker or w/c? No  Grab bars in the bathroom? Yes  Shower chair or bench in shower? Yes  Elevated toilet seat or a handicapped toilet? Yes   TIMED UP AND GO:  Was the test performed? No . Telephonic Visit  Cognitive Function:        06/02/2023   10:43 AM  6CIT Screen  What Year? 0 points  What month? 0 points  What time? 0 points  Count back from 20 0 points  Months in reverse 0 points  Repeat phrase 0 points  Total Score 0 points    Immunizations Immunization History  Administered Date(s) Administered   Fluad Quad(high Dose 65+) 10/11/2019, 09/25/2020, 11/14/2022   Influenza Whole 11/07/2007   Influenza, High Dose Seasonal PF 11/25/2015, 09/27/2017, 10/09/2018   Influenza-Unspecified 10/20/2013, 09/19/2016   PFIZER Comirnaty(Gray Top)Covid-19 Tri-Sucrose Vaccine 01/26/2020, 02/16/2020, 03/24/2021   PFIZER(Purple Top)SARS-COV-2 Vaccination 01/26/2020, 02/16/2020, 09/15/2020   Pneumococcal Conjugate-13 10/19/2019   Pneumococcal Polysaccharide-23 07/29/2010, 08/29/2017   Respiratory Syncytial Virus Vaccine,Recomb Aduvanted(Arexvy) 11/14/2022   Td 07/09/2010   Td (Adult),5 Lf Tetanus Toxid, Preservative Free 07/09/2010   Tdap 05/28/2020   Zoster Recombinat (Shingrix) 11/30/2017, 01/31/2018   Zoster, Live 03/08/2008    TDAP status: Up to date  Flu Vaccine status: Up to date  Pneumococcal vaccine status: Up to date  Covid-19  vaccine status: Completed vaccines  Qualifies for Shingles Vaccine? Yes   Zostavax completed Yes   Shingrix Completed?: Yes  Screening Tests Health Maintenance   Topic Date Due   COVID-19 Vaccine (7 - 2023-24 season) 08/20/2022   INFLUENZA VACCINE  07/21/2023   Medicare Annual Wellness (AWV)  06/01/2024   DEXA SCAN  04/21/2025   DTaP/Tdap/Td (4 - Td or Tdap) 05/28/2030   Pneumonia Vaccine 73+ Years old  Completed   Zoster Vaccines- Shingrix  Completed   HPV VACCINES  Aged Out   Hepatitis C Screening  Discontinued    Health Maintenance  Health Maintenance Due  Topic Date Due   COVID-19 Vaccine (7 - 2023-24 season) 08/20/2022    Colorectal cancer screening: No longer required.   Mammogram status: Completed 12/28/2021. Repeat every year (Patient goes every 2 years)  Bone Density status: Completed 04/21/2022. Results reflect: Bone density results: OSTEOPENIA. Repeat every 3 years.  Lung Cancer Screening: (Low Dose CT Chest recommended if Age 69-80 years, 30 pack-year currently smoking OR have quit w/in 15years.) does not qualify.   Lung Cancer Screening Referral: no  Additional Screening:  Hepatitis C Screening: does qualify; Completed 03/18/2021  Vision Screening: Recommended annual ophthalmology exams for early detection of glaucoma and other disorders of the eye. Is the patient up to date with their annual eye exam?  Yes  Who is the provider or what is the name of the office in which the patient attends annual eye exams? Manning Charity, OD. If pt is not established with a provider, would they like to be referred to a provider to establish care? No .   Dental Screening: Recommended annual dental exams for proper oral hygiene  Community Resource Referral / Chronic Care Management: CRR required this visit?  No   CCM required this visit?  No      Plan:     I have personally reviewed and noted the following in the patient's chart:   Medical and social history Use of alcohol, tobacco or illicit drugs  Current medications and supplements including opioid prescriptions. Patient is not currently taking opioid prescriptions. Functional  ability and status Nutritional status Physical activity Advanced directives List of other physicians Hospitalizations, surgeries, and ER visits in previous 12 months Vitals Screenings to include cognitive, depression, and falls Referrals and appointments  In addition, I have reviewed and discussed with patient certain preventive protocols, quality metrics, and best practice recommendations. A written personalized care plan for preventive services as well as general preventive health recommendations were provided to patient.     Mickeal Needy, LPN   1/61/0960   Nurse Notes: Normal cognitive status assessed by direct observation via telephone conversation by this Nurse Health Advisor. No abnormalities found.

## 2023-06-13 ENCOUNTER — Encounter: Payer: Self-pay | Admitting: Internal Medicine

## 2023-06-13 NOTE — Patient Instructions (Addendum)

## 2023-06-13 NOTE — Progress Notes (Unsigned)
Subjective:    Patient ID: Abigail Long, female    DOB: [birth year], 82 y.o.   MRN: [id number]      HPI Tyna is here for a Physical exam and her chronic medical problems.   Intermittent lower lip tremor.  Tends to come more with stressor aggravation.     Medications and allergies reviewed with patient and updated if appropriate.  Current Outpatient Medications on File Prior to Visit  Medication Sig Dispense Refill   amLODipine (NORVASC) 2.5 MG tablet Take 1 tablet (2.5 mg total) by mouth daily. 90 tablet 2   Ascorbic Acid (VITAMIN C PO) Take by mouth.     BIOTIN PO Take by mouth.     CALCIUM PO Take 600 mg by mouth. Takes 2     Cholecalciferol (VITAMIN D3 PO) Take by mouth.     D 1000 25 MCG (1000 UT) capsule SMARTSIG:1 By Mouth     Glucosamine-Chondroit-Vit C-Mn (GLUCOSAMINE 1500 COMPLEX PO) Take by mouth daily. Takes 2     meclizine (ANTIVERT) 25 MG tablet Take 1 tablet (25 mg total) by mouth 3 (three) times daily as needed for dizziness. 30 tablet 1   Multiple Vitamins-Minerals (CENTRUM SILVER PO) Take by mouth daily.     No current facility-administered medications on file prior to visit.    Review of Systems  Constitutional:  Negative for fever.  HENT:  Positive for postnasal drip.   Eyes:  Negative for visual disturbance.  Respiratory:  Negative for cough, shortness of breath and wheezing.   Cardiovascular:  Negative for chest pain, palpitations and leg swelling.  Gastrointestinal:  Negative for abdominal pain, blood in stool, constipation and diarrhea.       No gerd  Genitourinary:  Negative for dysuria.  Musculoskeletal:  Negative for arthralgias and back pain.  Skin:  Negative for rash.  Neurological:  Negative for light-headedness and headaches.  Psychiatric/Behavioral:  Negative for dysphoric mood. The patient is not nervous/anxious.        Objective:   Vitals:   06/14/23 0754  BP: 120/78  Pulse: (!) 33  Temp: 98.5 F (36.9 C)  SpO2: 93%    Filed Weights   06/14/23 0754  Weight: 199 lb (90.3 kg)   Body mass index is 35.25 kg/m.  BP Readings from Last 3 Encounters:  06/14/23 120/78  02/17/23 (!) 140/80  06/11/22 134/86    Wt Readings from Last 3 Encounters:  06/14/23 199 lb (90.3 kg)  06/02/23 195 lb (88.5 kg)  02/17/23 189 lb (85.7 kg)       Physical Exam Constitutional: She appears well-developed and well-nourished. No distress.  HENT:  Head: Normocephalic and atraumatic.  Right Ear: External ear normal. Normal ear canal and TM Left Ear: External ear normal.  Normal ear canal and TM Mouth/Throat: Oropharynx is clear and moist.  Eyes: Conjunctivae normal.  Neck: Neck supple. No tracheal deviation present. No thyromegaly present.  No carotid bruit  Cardiovascular: Normal rate, regular rhythm and normal heart sounds.   No murmur heard.  No edema. Pulmonary/Chest: Effort normal and breath sounds normal. No respiratory distress. She has no wheezes. She has no rales.  Breast: deferred   Abdominal: Soft. She exhibits no distension. There is no tenderness.  Lymphadenopathy: She has no cervical adenopathy.  Skin: Skin is warm and dry. She is not diaphoretic.  Psychiatric: She has a normal mood and affect. Her behavior is normal.     Lab Results  Component Value Date  WBC 5.0 06/11/2022   HGB 14.2 06/11/2022   HCT 43.4 06/11/2022   PLT 283.0 06/11/2022   GLUCOSE 94 06/11/2022   CHOL 192 06/11/2022   TRIG 72.0 06/11/2022   HDL 66.70 06/11/2022   LDLDIRECT 145.8 07/24/2013   LDLCALC 111 (H) 06/11/2022   ALT 22 06/11/2022   AST 22 06/11/2022   NA 137 06/11/2022   K 4.7 06/11/2022   CL 99 06/11/2022   CREATININE 0.68 06/11/2022   BUN 19 06/11/2022   CO2 34 (H) 06/11/2022   TSH 2.92 10/19/2019   INR 1.06 07/22/2011   HGBA1C 5.7 06/11/2022         Assessment & Plan:   Physical exam: Screening blood work  ordered Exercise  some walking - not enough Weight  trying to lose weight Substance  abuse  none   Reviewed recommended immunizations.   Health Maintenance  Topic Date Due   COVID-19 Vaccine (7 - 2023-24 season) 08/20/2022   INFLUENZA VACCINE  07/21/2023   Medicare Annual Wellness (AWV)  06/01/2024   DEXA SCAN  04/21/2025   DTaP/Tdap/Td (4 - Td or Tdap) 05/28/2030   Pneumonia Vaccine 32+ Years old  Completed   Zoster Vaccines- Shingrix  Completed   HPV VACCINES  Aged Out   Hepatitis C Screening  Discontinued          See Problem List for Assessment and Plan of chronic medical problems.

## 2023-06-14 ENCOUNTER — Ambulatory Visit (INDEPENDENT_AMBULATORY_CARE_PROVIDER_SITE_OTHER): Payer: Medicare Other | Admitting: Internal Medicine

## 2023-06-14 VITALS — BP 120/78 | HR 33 | Temp 98.5°F | Ht 63.0 in | Wt 199.0 lb

## 2023-06-14 DIAGNOSIS — R251 Tremor, unspecified: Secondary | ICD-10-CM | POA: Diagnosis not present

## 2023-06-14 DIAGNOSIS — M8589 Other specified disorders of bone density and structure, multiple sites: Secondary | ICD-10-CM

## 2023-06-14 DIAGNOSIS — E6609 Other obesity due to excess calories: Secondary | ICD-10-CM

## 2023-06-14 DIAGNOSIS — Z6831 Body mass index (BMI) 31.0-31.9, adult: Secondary | ICD-10-CM | POA: Diagnosis not present

## 2023-06-14 DIAGNOSIS — E782 Mixed hyperlipidemia: Secondary | ICD-10-CM

## 2023-06-14 DIAGNOSIS — R7303 Prediabetes: Secondary | ICD-10-CM | POA: Diagnosis not present

## 2023-06-14 DIAGNOSIS — Z0000 Encounter for general adult medical examination without abnormal findings: Secondary | ICD-10-CM | POA: Diagnosis not present

## 2023-06-14 DIAGNOSIS — I10 Essential (primary) hypertension: Secondary | ICD-10-CM

## 2023-06-14 LAB — COMPREHENSIVE METABOLIC PANEL
ALT: 22 U/L (ref 0–35)
AST: 22 U/L (ref 0–37)
Albumin: 4.4 g/dL (ref 3.5–5.2)
Alkaline Phosphatase: 47 U/L (ref 39–117)
BUN: 20 mg/dL (ref 6–23)
CO2: 30 mEq/L (ref 19–32)
Calcium: 9.7 mg/dL (ref 8.4–10.5)
Chloride: 102 mEq/L (ref 96–112)
Creatinine, Ser: 0.82 mg/dL (ref 0.40–1.20)
GFR: 67.62 mL/min (ref >60.00)
Glucose, Bld: 96 mg/dL (ref 70–99)
Potassium: 4.6 mEq/L (ref 3.5–5.1)
Sodium: 141 mEq/L (ref 135–145)
Total Bilirubin: 0.5 mg/dL (ref 0.2–1.2)
Total Protein: 7.4 g/dL (ref 6.0–8.3)

## 2023-06-14 LAB — CBC WITH DIFFERENTIAL/PLATELET
Basophils Absolute: 0 10*3/uL (ref 0.0–0.1)
Basophils Relative: 0.4 % (ref 0.0–3.0)
Eosinophils Absolute: 0.2 10*3/uL (ref 0.0–0.7)
Eosinophils Relative: 3.3 % (ref 0.0–5.0)
HCT: 43.2 % (ref 36.0–46.0)
Hemoglobin: 14 g/dL (ref 12.0–15.0)
Lymphocytes Relative: 26.2 % (ref 12.0–46.0)
Lymphs Abs: 1.4 10*3/uL (ref 0.7–4.0)
MCHC: 32.3 g/dL (ref 30.0–36.0)
MCV: 83.4 fl (ref 78.0–100.0)
Monocytes Absolute: 0.6 10*3/uL (ref 0.1–1.0)
Monocytes Relative: 10.7 % (ref 3.0–12.0)
Neutro Abs: 3.3 10*3/uL (ref 1.4–7.7)
Neutrophils Relative %: 59.4 % (ref 43.0–77.0)
Platelets: 288 10*3/uL (ref 150.0–400.0)
RBC: 5.17 Mil/uL — ABNORMAL HIGH (ref 3.87–5.11)
RDW: 14.1 % (ref 11.5–15.5)
WBC: 5.5 10*3/uL (ref 4.0–10.5)

## 2023-06-14 LAB — LIPID PANEL
Cholesterol: 180 mg/dL (ref 0–200)
HDL: 59.4 mg/dL (ref >39.00)
LDL Cholesterol: 104 mg/dL — ABNORMAL HIGH (ref 0–99)
NonHDL: 120.89
Total CHOL/HDL Ratio: 3
Triglycerides: 83 mg/dL (ref 0.0–149.0)
VLDL: 16.6 mg/dL (ref 0.0–40.0)

## 2023-06-14 LAB — VITAMIN D 25 HYDROXY (VIT D DEFICIENCY, FRACTURES): VITD: 55.73 ng/mL (ref 30.00–100.00)

## 2023-06-14 LAB — TSH: TSH: 2.79 u[IU]/mL (ref 0.35–5.50)

## 2023-06-14 LAB — HEMOGLOBIN A1C: Hgb A1c MFr Bld: 5.8 % (ref 4.6–6.5)

## 2023-06-14 NOTE — Assessment & Plan Note (Addendum)
Chronic Check a1c Low sugar / carb diet Stressed regular exercise Encouraged weight loss 

## 2023-06-14 NOTE — Assessment & Plan Note (Addendum)
Chronic Walking but not enough -- increase Discussed diet Continue weight loss efforts

## 2023-06-14 NOTE — Assessment & Plan Note (Signed)
Chronic  DEXA up-to-date Monitored by her gynecologist Continue regular exercise Continue calcium, vitamin D Check vitamin d level

## 2023-06-14 NOTE — Assessment & Plan Note (Signed)
Chronic Regular exercise and healthy diet encouraged Check lipid panel, CMP Continue lifestyle control 

## 2023-06-14 NOTE — Assessment & Plan Note (Signed)
Chronic BP well controlled Continue amlodipine 2.5 mg daily Cmp, cbc

## 2023-06-14 NOTE — Assessment & Plan Note (Signed)
New  Bottom lip Typically comes with stress Monitor Discussed stress management

## 2023-07-07 DIAGNOSIS — H2513 Age-related nuclear cataract, bilateral: Secondary | ICD-10-CM | POA: Diagnosis not present

## 2023-07-07 DIAGNOSIS — H5203 Hypermetropia, bilateral: Secondary | ICD-10-CM | POA: Diagnosis not present

## 2023-08-01 DIAGNOSIS — Z85828 Personal history of other malignant neoplasm of skin: Secondary | ICD-10-CM | POA: Diagnosis not present

## 2023-08-01 DIAGNOSIS — C44311 Basal cell carcinoma of skin of nose: Secondary | ICD-10-CM | POA: Diagnosis not present

## 2023-08-01 DIAGNOSIS — L814 Other melanin hyperpigmentation: Secondary | ICD-10-CM | POA: Diagnosis not present

## 2023-08-01 DIAGNOSIS — L821 Other seborrheic keratosis: Secondary | ICD-10-CM | POA: Diagnosis not present

## 2023-09-08 DIAGNOSIS — C44311 Basal cell carcinoma of skin of nose: Secondary | ICD-10-CM | POA: Diagnosis not present

## 2023-10-13 ENCOUNTER — Encounter: Payer: Self-pay | Admitting: Internal Medicine

## 2023-10-13 NOTE — Progress Notes (Signed)
    Subjective:    Patient ID: Abigail Long, female    DOB: Apr 16, 1941, 82 y.o.   MRN: [id number]      HPI Abigail Long is here for  Chief Complaint  Patient presents with   Weight Loss    Wants to discuss options for weight loss (she is having a hard time getting the weight off)     Visual disturbance - about 3 days ago.  It was ? One or both eyes - was watching tv.  She saw wavy lines in her right periphery and was watching tv and did not see the persons nose and mouth.  She is not sure how long it lasted - after 10 minutes she went to bed and closed her eyes - it was gone by the morning. She did have a little headache after.   Tried to cut down on calories and has not been able to lose weight.  She is not exercising.  She is frustrated and wonders about weight loss medication.    Medications and allergies reviewed with patient and updated if appropriate.  Current Outpatient Medications on File Prior to Visit  Medication Sig Dispense Refill   amLODipine (NORVASC) 2.5 MG tablet Take 1 tablet (2.5 mg total) by mouth daily. 90 tablet 2   Ascorbic Acid (VITAMIN C PO) Take by mouth.     BIOTIN PO Take by mouth.     CALCIUM PO Take 600 mg by mouth. Takes 2     Cholecalciferol (VITAMIN D3 PO) Take by mouth.     D 1000 25 MCG (1000 UT) capsule SMARTSIG:1 By Mouth     Glucosamine-Chondroit-Vit C-Mn (GLUCOSAMINE 1500 COMPLEX PO) Take by mouth daily. Takes 2     meclizine (ANTIVERT) 25 MG tablet Take 1 tablet (25 mg total) by mouth 3 (three) times daily as needed for dizziness. 30 tablet 1   Multiple Vitamins-Minerals (CENTRUM SILVER PO) Take by mouth daily.     No current facility-administered medications on file prior to visit.    Review of Systems     Objective:   Vitals:   10/14/23 1051  BP: (!) 140/72  Pulse: 76  Temp: 98 F (36.7 C)  SpO2: 97%   BP Readings from Last 3 Encounters:  10/14/23 (!) 140/72  06/14/23 120/78  02/17/23 (!) 140/80   Wt Readings from Last 3  Encounters:  10/14/23 206 lb (93.4 kg)  06/14/23 199 lb (90.3 kg)  06/02/23 195 lb (88.5 kg)   Body mass index is 36.49 kg/m.    Physical Exam Constitutional:      General: She is not in acute distress.    Appearance: Normal appearance. She is not ill-appearing.  HENT:     Head: Normocephalic and atraumatic.  Skin:    General: Skin is warm and dry.  Neurological:     Mental Status: She is alert. Mental status is at baseline.  Psychiatric:        Mood and Affect: Mood normal.        Behavior: Behavior normal.        Thought Content: Thought content normal.        Judgment: Judgment normal.            Assessment & Plan:    See Problem List for Assessment and Plan of chronic medical problems.

## 2023-10-14 ENCOUNTER — Ambulatory Visit (INDEPENDENT_AMBULATORY_CARE_PROVIDER_SITE_OTHER): Payer: Medicare Other | Admitting: Internal Medicine

## 2023-10-14 VITALS — BP 140/72 | HR 76 | Temp 98.0°F | Ht 63.0 in | Wt 206.0 lb

## 2023-10-14 DIAGNOSIS — Z23 Encounter for immunization: Secondary | ICD-10-CM | POA: Diagnosis not present

## 2023-10-14 DIAGNOSIS — E6609 Other obesity due to excess calories: Secondary | ICD-10-CM | POA: Diagnosis not present

## 2023-10-14 DIAGNOSIS — Z6831 Body mass index (BMI) 31.0-31.9, adult: Secondary | ICD-10-CM

## 2023-10-14 DIAGNOSIS — I10 Essential (primary) hypertension: Secondary | ICD-10-CM

## 2023-10-14 DIAGNOSIS — E66811 Obesity, class 1: Secondary | ICD-10-CM

## 2023-10-14 NOTE — Assessment & Plan Note (Signed)
Chronic Has tried weight loss in the past-has done weight watchers and calorie restriction She wonders about weight loss medication.  Discussed different medications and possible side effects Emphasized that even weight loss medications are not necessarily a permanent fix and do have side effects She is decided to try lifestyle changes Stressed the importance of regular exercise Stressed eating more protein, fiber and vegetables Decrease snacking Avoid drinking calories

## 2023-10-14 NOTE — Assessment & Plan Note (Addendum)
Chronic BP slightly higher than ideal today, but typically lower Continue amlodipine 2.5 mg daily

## 2023-10-14 NOTE — Addendum Note (Signed)
Addended by: Karma Ganja on: 10/14/2023 03:03 PM   Modules accepted: Orders

## 2023-10-14 NOTE — Patient Instructions (Signed)
      Medications changes include :   none    

## 2023-11-20 ENCOUNTER — Other Ambulatory Visit: Payer: Self-pay | Admitting: Internal Medicine

## 2023-11-30 ENCOUNTER — Encounter: Payer: Self-pay | Admitting: Obstetrics and Gynecology

## 2023-11-30 ENCOUNTER — Ambulatory Visit (INDEPENDENT_AMBULATORY_CARE_PROVIDER_SITE_OTHER): Payer: Medicare Other | Admitting: Obstetrics and Gynecology

## 2023-11-30 VITALS — BP 122/72 | HR 74 | Ht 63.75 in | Wt 204.0 lb

## 2023-11-30 DIAGNOSIS — M810 Age-related osteoporosis without current pathological fracture: Secondary | ICD-10-CM | POA: Diagnosis not present

## 2023-11-30 DIAGNOSIS — N8110 Cystocele, unspecified: Secondary | ICD-10-CM | POA: Insufficient documentation

## 2023-11-30 DIAGNOSIS — Z01419 Encounter for gynecological examination (general) (routine) without abnormal findings: Secondary | ICD-10-CM | POA: Diagnosis not present

## 2023-11-30 NOTE — Progress Notes (Signed)
82 y.o. G2P2 postmenopausal female with pelvic organ prolapse (grade 3-4), osteopenia here for annual exam. Married.  Postmenopausal bleeding: none Pelvic discharge or pain: none Breast mass, nipple discharge or skin changes : none Last PAP: No results found for: "DIAGPAP", "HPVHIGH", "ADEQPAP" Last mammogram: 12/28/21 BIRADS 1, density b Last colonoscopy: 2022 Last DXA: 04/21/2022 Osteopenia T-Score -2.1 Sexually active: yes  Exercising: yes, walking occasionally, knows that she can do more Smoker: no  GYN HISTORY: No significant history  OB History  Gravida Para Term Preterm AB Living  2 2       2   SAB IAB Ectopic Multiple Live Births               # Outcome Date GA Lbr Len/2nd Weight Sex Type Anes PTL Lv  2 Para           1 Para             Past Medical History:  Diagnosis Date   Hypertension    Osteoporosis    osteopenia    Past Surgical History:  Procedure Laterality Date   COLONOSCOPY  12/20/2006    Negative,Dr Marina Goodell   DILATION AND CURETTAGE OF UTERUS     KNEE SURGERY     TONSILLECTOMY     TOOTH EXTRACTION     right side of mouth and implant placed     Current Outpatient Medications on File Prior to Visit  Medication Sig Dispense Refill   amLODipine (NORVASC) 2.5 MG tablet TAKE 1 TABLET(2.5 MG) BY MOUTH DAILY 90 tablet 2   Ascorbic Acid (VITAMIN C PO) Take by mouth.     BIOTIN PO Take by mouth.     CALCIUM PO Take 600 mg by mouth. Takes 2     Cholecalciferol (VITAMIN D3 PO) Take by mouth.     D 1000 25 MCG (1000 UT) capsule SMARTSIG:1 By Mouth     Glucosamine-Chondroit-Vit C-Mn (GLUCOSAMINE 1500 COMPLEX PO) Take by mouth daily. Takes 2     Multiple Vitamins-Minerals (CENTRUM SILVER PO) Take by mouth daily.     No current facility-administered medications on file prior to visit.    Social History   Socioeconomic History   Marital status: Married    Spouse name: Not on file   Number of children: Not on file   Years of education: Not on file    Highest education level: Not on file  Occupational History   Not on file  Tobacco Use   Smoking status: Never   Smokeless tobacco: Never  Vaping Use   Vaping status: Never Used  Substance and Sexual Activity   Alcohol use: No   Drug use: No   Sexual activity: Yes    Partners: Male    Birth control/protection: Post-menopausal    Comment: 1st intercourse- 81, partners- 2, married- 48 yrs   Other Topics Concern   Not on file  Social History Narrative   [facility]   Social Determinants of Health   Financial Resource Strain: Low Risk  (06/02/2023)   Overall Financial Resource Strain (CARDIA)    Difficulty of Paying Living Expenses: Not hard at all  Food Insecurity: No Food Insecurity (06/02/2023)   Hunger Vital Sign    Worried About Running Out of Food in the Last Year: Never true    Ran Out of Food in the Last Year: Never true  Transportation Needs: No Transportation Needs (06/02/2023)   PRAPARE -  Administrator, Civil Service (Medical): No    Lack of Transportation (Non-Medical): No  Physical Activity: Sufficiently Active (06/02/2023)   Exercise Vital Sign    Days of Exercise per Week: 5 days    Minutes of Exercise per Session: 30 min  Stress: No Stress Concern Present (06/02/2023)   [employer] of Occupational Health - Occupational Stress Questionnaire    Feeling of Stress : Not at all  Social Connections: Socially Integrated (06/02/2023)   Social Connection and Isolation Panel [NHANES]    Frequency of Communication with Friends and Family: Three times a week    Frequency of Social Gatherings with Friends and Family: Three times a week    Attends Religious Services: More than 4 times per year    Active Member of Clubs or Organizations: Yes    Attends Banker Meetings: More than 4 times per year    Marital Status: Married  Catering manager Violence: Not At Risk (06/02/2023)   Humiliation, Afraid, Rape, and Kick  questionnaire    Fear of Current or Ex-Partner: No    Emotionally Abused: No    Physically Abused: No    Sexually Abused: No    Family History  Problem Relation Age of Onset   Hypertension Mother    Heart disease Father 10       CBAG X 3; pacer   Stroke Maternal Aunt 72   Breast cancer Maternal Aunt    Cancer Maternal Grandfather        renal   Stomach cancer Paternal Grandfather    Cancer Paternal Grandfather        intra- abdominal   Bipolar disorder Son    Diabetes Neg Hx    Hyperlipidemia Neg Hx    Colon cancer Neg Hx    Colon polyps Neg Hx    Rectal cancer Neg Hx     No Known Allergies    PE Today's Vitals   11/30/23 0812  BP: 122/72  Pulse: 74  SpO2: 96%  Weight: 204 lb (92.5 kg)  Height: 5' 3.75" (1.619 m)   Body mass index is 35.29 kg/m.  Physical Exam Vitals reviewed. Exam conducted with a chaperone present.  Constitutional:      General: She is not in acute distress.    Appearance: Normal appearance.  HENT:     Head: Normocephalic and atraumatic.     Nose: Nose normal.  Eyes:     Extraocular Movements: Extraocular movements intact.     Conjunctiva/sclera: Conjunctivae normal.  Neck:     Thyroid: No thyroid mass, thyromegaly or thyroid tenderness.  Pulmonary:     Effort: Pulmonary effort is normal.  Chest:     Chest wall: No mass or tenderness.  Breasts:    Right: Normal. No swelling, mass, nipple discharge, skin change or tenderness.     Left: Normal. No swelling, mass, nipple discharge, skin change or tenderness.  Abdominal:     General: There is no distension.     Palpations: Abdomen is soft.     Tenderness: There is no abdominal tenderness.  Genitourinary:    General: Normal vulva.     Exam position: Lithotomy position.     Urethra: No prolapse.     Vagina: Normal. No vaginal discharge or bleeding.     Cervix: Normal. No lesion.     Uterus: Normal. Not enlarged and not tender.      Adnexa: Right adnexa normal and left adnexa  normal.  Comments: Prolapsed bladder and uterus Musculoskeletal:        General: Normal range of motion.     Cervical back: Normal range of motion.  Lymphadenopathy:     Upper Body:     Right upper body: No axillary adenopathy.     Left upper body: No axillary adenopathy.     Lower Body: No right inguinal adenopathy. No left inguinal adenopathy.  Skin:    General: Skin is warm and dry.  Neurological:     General: No focal deficit present.     Mental Status: She is alert.  Psychiatric:        Mood and Affect: Mood normal.        Behavior: Behavior normal.       Assessment and Plan:        Well woman exam with routine gynecological exam Assessment & Plan: Cervical cancer screening performed according to ASCCP guidelines. Encouraged annual mammogram screening Colonoscopy UTD DXA UTD Labs and immunizations with her primary Encouraged safe sexual practices as indicated Encouraged healthy lifestyle practices with diet and exercise For patients over 70yo, I recommend 1200mg  calcium daily and 800IU of vitamin D daily.    Pelvic organ prolapse quantification stage 3 cystocele Assessment & Plan: Seen by [facility] 2023 Opted for expectant management, continue     Rosalyn Gess, MD

## 2023-11-30 NOTE — Assessment & Plan Note (Signed)
Seen by [facility] 2023 Opted for expectant management, continue

## 2023-11-30 NOTE — Patient Instructions (Signed)
For patients under 50-80yo, I recommend 1200mg  calcium daily and 600IU of vitamin D daily. For patients over 80yo, I recommend 1200mg  calcium daily and 800IU of vitamin D daily.  Health Maintenance, Female Adopting a healthy lifestyle and getting preventive care are important in promoting health and wellness. Ask your health care provider about: The right schedule for you to have regular tests and exams. Things you can do on your own to prevent diseases and keep yourself healthy. What should I know about diet, weight, and exercise? Eat a healthy diet  Eat a diet that includes plenty of vegetables, fruits, low-fat dairy products, and lean protein. Do not eat a lot of foods that are high in solid fats, added sugars, or sodium. Maintain a healthy weight Body mass index (BMI) is used to identify weight problems. It estimates body fat based on height and weight. Your health care provider can help determine your BMI and help you achieve or maintain a healthy weight. Get regular exercise Get regular exercise. This is one of the most important things you can do for your health. Most adults should: Exercise for at least 150 minutes each week. The exercise should increase your heart rate and make you sweat (moderate-intensity exercise). Do strengthening exercises at least twice a week. This is in addition to the moderate-intensity exercise. Spend less time sitting. Even light physical activity can be beneficial. Watch cholesterol and blood lipids Have your blood tested for lipids and cholesterol at 80 years of age, then have this test every 5 years. Have your cholesterol levels checked more often if: Your lipid or cholesterol levels are high. You are older than 80 years of age. You are at high risk for heart disease. What should I know about cancer screening? Depending on your health history and family history, you may need to have cancer screening at various ages. This may include screening  for: Breast cancer. Cervical cancer. Colorectal cancer. Skin cancer. Lung cancer. What should I know about heart disease, diabetes, and high blood pressure? Blood pressure and heart disease High blood pressure causes heart disease and increases the risk of stroke. This is more likely to develop in people who have high blood pressure readings or are overweight. Have your blood pressure checked: Every 3-5 years if you are 83-80 years of age. Every year if you are 4 years old or older. Diabetes Have regular diabetes screenings. This checks your fasting blood sugar level. Have the screening done: Once every three years after age 68 if you are at a normal weight and have a low risk for diabetes. More often and at a younger age if you are overweight or have a high risk for diabetes. What should I know about preventing infection? Hepatitis B If you have a higher risk for hepatitis B, you should be screened for this virus. Talk with your health care provider to find out if you are at risk for hepatitis B infection. Hepatitis C Testing is recommended for: Everyone born from [birth year] through 1963. Anyone with known risk factors for hepatitis C. Sexually transmitted infections (STIs) Get screened for STIs, including gonorrhea and chlamydia, if: You are sexually active and are younger than 80 years of age. You are older than 80 years of age and your health care provider tells you that you are at risk for this type of infection. Your sexual activity has changed since you were last screened, and you are at increased risk for chlamydia or gonorrhea. Ask your health care provider if  you are at risk. Ask your health care provider about whether you are at high risk for HIV. Your health care provider may recommend a prescription medicine to help prevent HIV infection. If you choose to take medicine to prevent HIV, you should first get tested for HIV. You should then be tested every 3 months for as long as you  are taking the medicine. Osteoporosis and menopause Osteoporosis is a disease in which the bones lose minerals and strength with aging. This can result in bone fractures. If you are 44 years old or older, or if you are at risk for osteoporosis and fractures, ask your health care provider if you should: Be screened for bone loss. Take a calcium or vitamin D supplement to lower your risk of fractures. Be given hormone replacement therapy (HRT) to treat symptoms of menopause. Follow these instructions at home: Alcohol use Do not drink alcohol if: Your health care provider tells you not to drink. You are pregnant, may be pregnant, or are planning to become pregnant. If you drink alcohol: Limit how much you have to: 0-1 drink a day. Know how much alcohol is in your drink. In the [place] one drink equals one 12 oz bottle of beer (355 mL), one 5 oz glass of wine (148 mL), or one 1 oz glass of hard liquor (44 mL). Lifestyle Do not use any products that contain nicotine or tobacco. These products include cigarettes, chewing tobacco, and vaping devices, such as e-cigarettes. If you need help quitting, ask your health care provider. Do not use street drugs. Do not share needles. Ask your health care provider for help if you need support or information about quitting drugs. General instructions Schedule regular health, dental, and eye exams. Stay current with your vaccines. Tell your health care provider if: You often feel depressed. You have ever been abused or do not feel safe at home. Summary Adopting a healthy lifestyle and getting preventive care are important in promoting health and wellness. Follow your health care provider's instructions about healthy diet, exercising, and getting tested or screened for diseases. Follow your health care provider's instructions on monitoring your cholesterol and blood pressure. This information is not intended to replace advice given to you by your health  care provider. Make sure you discuss any questions you have with your health care provider. Document Revised: 04/27/2021 Document Reviewed: 04/27/2021 Elsevier Patient Education  2024 [employer].

## 2023-11-30 NOTE — Assessment & Plan Note (Signed)
 Cervical cancer screening performed according to ASCCP guidelines. Encouraged annual mammogram screening Colonoscopy UTD DXA UTD Labs and immunizations with her primary Encouraged safe sexual practices as indicated Encouraged healthy lifestyle practices with diet and exercise For patients over 80yo, I recommend 1200mg  calcium daily and 800IU of vitamin D daily.

## 2024-03-07 ENCOUNTER — Telehealth: Payer: Self-pay | Admitting: Internal Medicine

## 2024-03-07 NOTE — Telephone Encounter (Signed)
 Yes it would be safe for her to try this

## 2024-03-07 NOTE — Telephone Encounter (Unsigned)
 Copied from CRM [id number]. Topic: Clinical - Medical Advice >> Mar 07, 2024 12:15 PM Kathryne Eriksson wrote: Reason for CRM: Requesting A Call Back >> Mar 07, 2024 12:16 PM Kathryne Eriksson wrote: Patient called in wanting to know if it's okay for her to go on the "Go-Low Diet". Patient states she's gained a good bit of weight and want to try it. Patient call back number is [phone]

## 2024-03-08 NOTE — Telephone Encounter (Signed)
 Spoke with patient today.

## 2024-06-06 ENCOUNTER — Ambulatory Visit (INDEPENDENT_AMBULATORY_CARE_PROVIDER_SITE_OTHER): Payer: Medicare Other

## 2024-06-06 VITALS — Ht 63.0 in | Wt 204.0 lb

## 2024-06-06 DIAGNOSIS — Z0000 Encounter for general adult medical examination without abnormal findings: Secondary | ICD-10-CM

## 2024-06-06 NOTE — Patient Instructions (Addendum)
 Abigail Long , Thank you for taking time out of your busy schedule to complete your Annual Wellness Visit with me. I enjoyed our conversation and look forward to speaking with you again next year. I, as well as your care team,  appreciate your ongoing commitment to your health goals. Please review the following plan we discussed and let me know if I can assist you in the future. Your Game plan/ To Do List    Follow up Visits: Next Medicare AWV with our clinical staff: 06/07/2025.   Have you seen your provider in the last 6 months (3 months if uncontrolled diabetes)? No Next Office Visit with your provider: 07/16/2024.  Clinician Recommendations:  Aim for 30 minutes of exercise or brisk walking, 6-8 glasses of water, and 5 servings of fruits and vegetables each day.       This is a list of the screening recommended for you and due dates:  Health Maintenance  Topic Date Due   COVID-19 Vaccine (5 - 2024-25 season) 08/21/2023   Flu Shot  07/20/2024   DEXA scan (bone density measurement)  04/21/2025   Medicare Annual Wellness Visit  06/06/2025   DTaP/Tdap/Td vaccine (4 - Td or Tdap) 05/28/2030   Pneumococcal Vaccine for age over 19  Completed   Zoster (Shingles) Vaccine  Completed   HPV Vaccine  Aged Out   Meningitis B Vaccine  Aged Out   Hepatitis C Screening  Discontinued    Advanced directives: (Copy Requested) Please bring a copy of your health care power of attorney and living will to the office to be added to your chart at your convenience. You can mail to [facility] [address] [place], [place] [address] or email to [email or web address] Advance Care Planning is important because it:  [x]  Makes sure you receive the medical care that is consistent with your values, goals, and preferences  [x]  It provides guidance to your family and loved ones and reduces their decisional burden about whether or not they are making the right decisions based on your  wishes.  Follow the link provided in your after visit summary or read over the paperwork we have mailed to you to help you started getting your Advance Directives in place. If you need assistance in completing these, please reach out to us  so that we can help you!  See attachments for Preventive Care and Fall Prevention Tips.

## 2024-06-06 NOTE — Progress Notes (Signed)
 Subjective:   Abigail Long is a 83 y.o. who presents for a Medicare Wellness preventive visit.  As a reminder, Annual Wellness Visits don't include a physical exam, and some assessments may be limited, especially if this visit is performed virtually. We may recommend an in-person follow-up visit with your provider if needed.  Visit Complete: Virtual I connected with  Abigail Long on 06/06/24 by a audio enabled telemedicine application and verified that I am speaking with the correct person using two identifiers.  Patient Location: Home  Provider Location: Home Office  I discussed the limitations of evaluation and management by telemedicine. The patient expressed understanding and agreed to proceed.  Vital Signs: Because this visit was a virtual/telehealth visit, some criteria may be missing or patient reported. Any vitals not documented were not able to be obtained and vitals that have been documented are patient reported.  VideoDeclined- This patient declined Librarian, academic. Therefore the visit was completed with audio only.  Persons Participating in Visit: Patient.  AWV Questionnaire: No: Patient Medicare AWV questionnaire was not completed prior to this visit.  Cardiac Risk Factors include: advanced age (>44men, >59 women);hypertension;dyslipidemia;obesity (BMI >30kg/m2)     Objective:    Today's Vitals   06/06/24 1001  Weight: 204 lb (92.5 kg)  Height: 5' 3 (1.6 m)   Body mass index is 36.14 kg/m.     06/06/2024   10:06 AM 06/02/2023   10:33 AM 05/31/2022   10:25 AM  Advanced Directives  Does Patient Have a Medical Advance Directive? Yes Yes Yes  Type of Estate agent of [place];Living will Healthcare Power of [place];Living will Healthcare Power of [place];Living will  Copy of Healthcare Power of Attorney in Chart? No - copy requested No - copy requested No - copy requested    Current Medications  (verified) Outpatient Encounter Medications as of 06/06/2024  Medication Sig   amLODipine  (NORVASC ) 2.5 MG tablet TAKE 1 TABLET(2.5 MG) BY MOUTH DAILY   Ascorbic Acid (VITAMIN C PO) Take by mouth.   CALCIUM PO Take 600 mg by mouth. Takes 2   Cholecalciferol (VITAMIN D3 PO) Take by mouth.   D 1000 25 MCG (1000 UT) capsule SMARTSIG:1 By Mouth   Glucosamine-Chondroit-Vit C-Mn (GLUCOSAMINE 1500 COMPLEX PO) Take by mouth daily. Takes 2   Multiple Vitamins-Minerals (CENTRUM SILVER PO) Take by mouth daily.   BIOTIN PO Take by mouth. (Patient not taking: Reported on 06/06/2024)   No facility-administered encounter medications on file as of 06/06/2024.    Allergies (verified) Patient has no known allergies.   History: Past Medical History:  Diagnosis Date   Hypertension    Osteoporosis    osteopenia   Past Surgical History:  Procedure Laterality Date   COLONOSCOPY  12/20/2006    Negative,Dr Elvin Hammer   DILATION AND CURETTAGE OF UTERUS     KNEE SURGERY     TONSILLECTOMY     TOOTH EXTRACTION     right side of mouth and implant placed    Family History  Problem Relation Age of Onset   Hypertension Mother    Heart disease Father 96       CBAG X 3; pacer   Stroke Maternal Aunt 65   Breast cancer Maternal Aunt    Cancer Maternal Grandfather        renal   Stomach cancer Paternal Grandfather    Cancer Paternal Grandfather        intra- abdominal   Bipolar disorder  Son    Diabetes Neg Hx    Hyperlipidemia Neg Hx    Colon cancer Neg Hx    Colon polyps Neg Hx    Rectal cancer Neg Hx    Social History   Socioeconomic History   Marital status: Married    Spouse name: Aimee Houseman   Number of children: Not on file   Years of education: Not on file   Highest education level: Not on file  Occupational History   Occupation: RETIRED  Tobacco Use   Smoking status: Never   Smokeless tobacco: Never  Vaping Use   Vaping status: Never Used  Substance and Sexual Activity   Alcohol use: No    Drug use: No   Sexual activity: Yes    Partners: Male    Birth control/protection: Post-menopausal    Comment: 1st intercourse- 18, partners- 2, married- 48 yrs   Other Topics Concern   Not on file  Social History Narrative   Ex[facility]   Lives with husband/2025 and 1 dog and 1 cat/2025   Social Drivers of Health   Financial Resource Strain: Low Risk  (06/06/2024)   Overall Financial Resource Strain (CARDIA)    Difficulty of Paying Living Expenses: Not hard at all  Food Insecurity: No Food Insecurity (06/06/2024)   Hunger Vital Sign    Worried About Running Out of Food in the Last Year: Never true    Ran Out of Food in the Last Year: Never true  Transportation Needs: No Transportation Needs (06/06/2024)   PRAPARE - Administrator, Civil Service (Medical): No    Lack of Transportation (Non-Medical): No  Physical Activity: Inactive (06/06/2024)   Exercise Vital Sign    Days of Exercise per Week: 0 days    Minutes of Exercise per Session: 0 min  Stress: No Stress Concern Present (06/06/2024)   [employer] of Occupational Health - Occupational Stress Questionnaire    Feeling of Stress: Not at all  Social Connections: Socially Integrated (06/06/2024)   Social Connection and Isolation Panel    Frequency of Communication with Friends and Family: Three times a week    Frequency of Social Gatherings with Friends and Family: Twice a week    Attends Religious Services: More than 4 times per year    Active Member of [employer] or Organizations: Yes    Attends Banker Meetings: 1 to 4 times per year    Marital Status: Married    Tobacco Counseling Counseling given: Not Answered    Clinical Intake:  Pre-visit preparation completed: Yes  Pain : No/denies pain     BMI - recorded: 36.14 Nutritional Status: BMI > 30  Obese Nutritional Risks: Nausea/ vomitting/ diarrhea (diarrhea) Diabetes: No  Lab Results  Component  Value Date   HGBA1C 5.8 06/14/2023   HGBA1C 5.7 06/11/2022   HGBA1C 5.7 03/18/2021     How often do you need to have someone help you when you read instructions, pamphlets, or other written materials from your doctor or pharmacy?: 1 - Never  Interpreter Needed?: No  Information entered by :: Danelia Snodgrass, RMA   Activities of Daily Living     06/06/2024   10:01 AM  In your present state of health, do you have any difficulty performing the following activities:  Hearing? 0  Vision? 0  Difficulty concentrating or making decisions? 0  Walking or climbing stairs? 0  Dressing or bathing? 0  Doing  errands, shopping? 0  Preparing Food and eating ? N  Using the Toilet? N  In the past six months, have you accidently leaked urine? N  Do you have problems with loss of bowel control? N  Managing your Medications? N  Managing your Finances? N  Housekeeping or managing your Housekeeping? N    Patient Care Team: Colene Dauphin, MD as PCP - General (Internal Medicine) Pauline Bos, OD as Referring Physician (Optometry)  I have updated your Care Teams any recent Medical Services you may have received from other providers in the past year.     Assessment:   This is a routine wellness examination for Abigail Long.  Hearing/Vision screen Hearing Screening - Comments:: Denies hearing difficulties   Vision Screening - Comments:: Wears eyeglasses/ [facility]   Goals Addressed               This Visit's Progress     My goal for 2024 is to continue to lose weight and weight goal 150 pounds. (pt-stated)        Starting over with weight lose/2025       Depression Screen     06/06/2024   10:13 AM 11/30/2023    8:11 AM 06/14/2023    7:57 AM 06/02/2023   10:40 AM 02/17/2023    7:59 AM 06/11/2022   10:03 AM 05/31/2022   10:27 AM  PHQ 2/9 Scores  PHQ - 2 Score 2 0 0 0 0 0 0  PHQ- 9 Score 7 0  0 3      Fall Risk     06/06/2024   10:10 AM 11/30/2023    8:11 AM 06/14/2023     7:56 AM 06/02/2023   10:40 AM 02/17/2023    7:59 AM  Fall Risk   Falls in the past year? 0 0 0 0 0  Number falls in past yr: 0 0 0 0 0  Injury with Fall? 0 0 0 0 0  Risk for fall due to :  No Fall Risks No Fall Risks No Fall Risks No Fall Risks  Follow up Falls evaluation completed;Falls prevention discussed Falls evaluation completed Falls evaluation completed Falls prevention discussed Falls evaluation completed    MEDICARE RISK AT HOME:  Medicare Risk at Home Any stairs in or around the home?: No If so, are there any without handrails?: No Home free of loose throw rugs in walkways, pet beds, electrical cords, etc?: Yes Adequate lighting in your home to reduce risk of falls?: Yes Life alert?: No Use of a cane, walker or w/c?: No Grab bars in the bathroom?: Yes Shower chair or bench in shower?: Yes Elevated toilet seat or a handicapped toilet?: Yes  TIMED UP AND GO:  Was the test performed?  No  Cognitive Function: Declined/Normal: No cognitive concerns noted by patient or family. Patient alert, oriented, able to answer questions appropriately and recall recent events. No signs of memory loss or confusion.        06/02/2023   10:43 AM  6CIT Screen  What Year? 0 points  What month? 0 points  What time? 0 points  Count back from 20 0 points  Months in reverse 0 points  Repeat phrase 0 points  Total Score 0 points    Immunizations Immunization History  Administered Date(s) Administered   Fluad Quad(high Dose 65+) 10/11/2019, 09/25/2020, 11/14/2022   Fluad Trivalent(High Dose 65+) 10/14/2023   Influenza Whole 11/07/2007   Influenza, High Dose Seasonal PF 11/25/2015, 09/27/2017, 10/09/2018  Influenza-Unspecified 10/20/2013, 09/19/2016   PFIZER Comirnaty(Gray Top)Covid-19 Tri-Sucrose Vaccine 03/24/2021   PFIZER(Purple Top)SARS-COV-2 Vaccination 01/26/2020, 02/16/2020, 09/15/2020   Pneumococcal Conjugate-13 10/19/2019   Pneumococcal Polysaccharide-23 07/29/2010,  08/29/2017   Respiratory Syncytial Virus Vaccine,Recomb Aduvanted(Arexvy) 11/14/2022   Td 07/09/2010   Td (Adult),5 Lf Tetanus Toxid, Preservative Free 07/09/2010   Tdap 05/28/2020   Zoster Recombinant(Shingrix) 11/30/2017, 01/31/2018   Zoster, Live 03/08/2008    Screening Tests Health Maintenance  Topic Date Due   COVID-19 Vaccine (5 - 2024-25 season) 08/21/2023   INFLUENZA VACCINE  07/20/2024   DEXA SCAN  04/21/2025   Medicare Annual Wellness (AWV)  06/06/2025   DTaP/Tdap/Td (4 - Td or Tdap) 05/28/2030   Pneumococcal Vaccine: 50+ Years  Completed   Zoster Vaccines- Shingrix  Completed   HPV VACCINES  Aged Out   Meningococcal B Vaccine  Aged Out   Hepatitis C Screening  Discontinued    Health Maintenance  Health Maintenance Due  Topic Date Due   COVID-19 Vaccine (5 - 2024-25 season) 08/21/2023   Health Maintenance Items Addressed: See Nurse Notes at the end of this note  Additional Screening:  Vision Screening: Recommended annual ophthalmology exams for early detection of glaucoma and other disorders of the eye. Would you like a referral to an eye doctor? No    Dental Screening: Recommended annual dental exams for proper oral hygiene  Community Resource Referral / Chronic Care Management: CRR required this visit?  No   CCM required this visit?  No   Plan:    I have personally reviewed and noted the following in the patient's chart:   Medical and social history Use of alcohol, tobacco or illicit drugs  Current medications and supplements including opioid prescriptions. Patient is not currently taking opioid prescriptions. Functional ability and status Nutritional status Physical activity Advanced directives List of other physicians Hospitalizations, surgeries, and ER visits in previous 12 months Vitals Screenings to include cognitive, depression, and falls Referrals and appointments  In addition, I have reviewed and discussed with patient certain  preventive protocols, quality metrics, and best practice recommendations. A written personalized care plan for preventive services as well as general preventive health recommendations were provided to patient.   Venson Ferencz L Pola Furno, CMA   06/06/2024   After Visit Summary: (MyChart) Due to this being a telephonic visit, the after visit summary with patients personalized plan was offered to patient via MyChart   Notes: Patient is concerned about her weight gain.  She would like to discuss [facility] with Dr. Donnette Gal during her next visit.  Patient is up to date on all health maintenance with no concerns to address today.

## 2024-07-16 ENCOUNTER — Ambulatory Visit (INDEPENDENT_AMBULATORY_CARE_PROVIDER_SITE_OTHER): Admitting: Internal Medicine

## 2024-07-16 ENCOUNTER — Encounter: Payer: Self-pay | Admitting: Internal Medicine

## 2024-07-16 VITALS — BP 124/80 | HR 65 | Temp 97.9°F | Ht 63.0 in | Wt 210.0 lb

## 2024-07-16 DIAGNOSIS — M8589 Other specified disorders of bone density and structure, multiple sites: Secondary | ICD-10-CM

## 2024-07-16 DIAGNOSIS — E782 Mixed hyperlipidemia: Secondary | ICD-10-CM | POA: Diagnosis not present

## 2024-07-16 DIAGNOSIS — R7303 Prediabetes: Secondary | ICD-10-CM

## 2024-07-16 DIAGNOSIS — I10 Essential (primary) hypertension: Secondary | ICD-10-CM

## 2024-07-16 DIAGNOSIS — Z0000 Encounter for general adult medical examination without abnormal findings: Secondary | ICD-10-CM

## 2024-07-16 LAB — COMPREHENSIVE METABOLIC PANEL WITH GFR
ALT: 27 U/L (ref 0–35)
AST: 26 U/L (ref 0–37)
Albumin: 4.4 g/dL (ref 3.5–5.2)
Alkaline Phosphatase: 53 U/L (ref 39–117)
BUN: 16 mg/dL (ref 6–23)
CO2: 30 meq/L (ref 19–32)
Calcium: 9 mg/dL (ref 8.4–10.5)
Chloride: 103 meq/L (ref 96–112)
Creatinine, Ser: 0.8 mg/dL (ref 0.40–1.20)
GFR: 69.13 mL/min (ref >60.00)
Glucose, Bld: 78 mg/dL (ref 70–99)
Potassium: 3.8 meq/L (ref 3.5–5.1)
Sodium: 140 meq/L (ref 135–145)
Total Bilirubin: 0.4 mg/dL (ref 0.2–1.2)
Total Protein: 7.1 g/dL (ref 6.0–8.3)

## 2024-07-16 LAB — LIPID PANEL
Cholesterol: 171 mg/dL (ref 0–200)
HDL: 57.4 mg/dL (ref >39.00)
LDL Cholesterol: 88 mg/dL (ref 0–99)
NonHDL: 113.57
Total CHOL/HDL Ratio: 3
Triglycerides: 127 mg/dL (ref 0.0–149.0)
VLDL: 25.4 mg/dL (ref 0.0–40.0)

## 2024-07-16 LAB — CBC WITH DIFFERENTIAL/PLATELET
Basophils Absolute: 0 K/uL (ref 0.0–0.1)
Basophils Relative: 0.3 % (ref 0.0–3.0)
Eosinophils Absolute: 0.2 K/uL (ref 0.0–0.7)
Eosinophils Relative: 3 % (ref 0.0–5.0)
HCT: 42.5 % (ref 36.0–46.0)
Hemoglobin: 13.7 g/dL (ref 12.0–15.0)
Lymphocytes Relative: 22.4 % (ref 12.0–46.0)
Lymphs Abs: 1.3 K/uL (ref 0.7–4.0)
MCHC: 32.3 g/dL (ref 30.0–36.0)
MCV: 83.1 fl (ref 78.0–100.0)
Monocytes Absolute: 0.8 K/uL (ref 0.1–1.0)
Monocytes Relative: 13.4 % — ABNORMAL HIGH (ref 3.0–12.0)
Neutro Abs: 3.4 K/uL (ref 1.4–7.7)
Neutrophils Relative %: 60.9 % (ref 43.0–77.0)
Platelets: 284 K/uL (ref 150.0–400.0)
RBC: 5.12 Mil/uL — ABNORMAL HIGH (ref 3.87–5.11)
RDW: 14.4 % (ref 11.5–15.5)
WBC: 5.6 K/uL (ref 4.0–10.5)

## 2024-07-16 LAB — HEMOGLOBIN A1C: Hgb A1c MFr Bld: 6.3 % (ref 4.6–6.5)

## 2024-07-16 NOTE — Assessment & Plan Note (Addendum)
 Chronic BMI 37.2 with htn, hld, prediabetes Encouraged weight loss Exercise, decrease calories and healthy diet discussed

## 2024-07-16 NOTE — Assessment & Plan Note (Signed)
Chronic Regular exercise and healthy diet encouraged Check lipid panel, CMP, TSH Continue lifestyle control 

## 2024-07-16 NOTE — Progress Notes (Signed)
 Subjective:    Patient ID: Abigail Long, female    DOB: 09-21-1941, 83 y.o.   MRN: [id number]      HPI Nigel is here for a Physical exam and her chronic medical problems.   No concerns.     Medications and allergies reviewed with patient and updated if appropriate.  Current Outpatient Medications on File Prior to Visit  Medication Sig Dispense Refill   amLODipine  (NORVASC ) 2.5 MG tablet TAKE 1 TABLET(2.5 MG) BY MOUTH DAILY 90 tablet 2   Ascorbic Acid (VITAMIN C PO) Take by mouth.     BIOTIN PO Take by mouth.     CALCIUM PO Take 600 mg by mouth. Takes 2     D 1000 25 MCG (1000 UT) capsule SMARTSIG:1 By Mouth     Glucosamine-Chondroit-Vit C-Mn (GLUCOSAMINE 1500 COMPLEX PO) Take by mouth daily. Takes 2     Multiple Vitamins-Minerals (CENTRUM SILVER PO) Take by mouth daily.     No current facility-administered medications on file prior to visit.    Review of Systems  Constitutional:  Negative for fever.  HENT:  Positive for postnasal drip.   Eyes:  Negative for visual disturbance.  Respiratory:  Positive for cough (PND related). Negative for shortness of breath and wheezing.   Cardiovascular:  Negative for chest pain, palpitations and leg swelling.  Gastrointestinal:  Negative for abdominal pain, blood in stool, constipation and diarrhea.       No gerd  Genitourinary:  Negative for dysuria.  Musculoskeletal:  Positive for arthralgias (hands). Negative for back pain.  Skin:  Negative for rash.  Neurological:  Negative for dizziness, light-headedness and headaches.  Psychiatric/Behavioral:  Negative for dysphoric mood and sleep disturbance. The patient is not nervous/anxious.        Objective:   Vitals:   07/16/24 0858  BP: 124/80  Pulse: 65  Temp: 97.9 F (36.6 C)  SpO2: 95%   Filed Weights   07/16/24 0858  Weight: 210 lb (95.3 kg)   Body mass index is 37.2 kg/m.  BP Readings from Last 3 Encounters:  07/16/24 124/80  11/30/23 122/72  10/14/23 (!)  140/72    Wt Readings from Last 3 Encounters:  07/16/24 210 lb (95.3 kg)  06/06/24 204 lb (92.5 kg)  11/30/23 204 lb (92.5 kg)       Physical Exam Constitutional: She appears well-developed and well-nourished. No distress.  HENT:  Head: Normocephalic and atraumatic.  Right Ear: External ear normal. Normal ear canal and TM Left Ear: External ear normal.  Normal ear canal and TM Mouth/Throat: Oropharynx is clear and moist.  Eyes: Conjunctivae normal.  Neck: Neck supple. No tracheal deviation present. No thyromegaly present.  No carotid bruit  Cardiovascular: Normal rate, regular rhythm and normal heart sounds.   No murmur heard.  No edema. Pulmonary/Chest: Effort normal and breath sounds normal. No respiratory distress. She has no wheezes. She has no rales.  Breast: deferred   Abdominal: Soft. She exhibits no distension. There is no tenderness.  Lymphadenopathy: She has no cervical adenopathy.  Skin: Skin is warm and dry. She is not diaphoretic.  Psychiatric: She has a normal mood and affect. Her behavior is normal.     Lab Results  Component Value Date   WBC 5.5 06/14/2023   HGB 14.0 06/14/2023   HCT 43.2 06/14/2023   PLT 288.0 06/14/2023   GLUCOSE 96 06/14/2023   CHOL 180 06/14/2023   TRIG 83.0 06/14/2023   HDL 59.40 06/14/2023  LDLDIRECT 145.8 07/24/2013   LDLCALC 104 (H) 06/14/2023   ALT 22 06/14/2023   AST 22 06/14/2023   NA 141 06/14/2023   K 4.6 06/14/2023   CL 102 06/14/2023   CREATININE 0.82 06/14/2023   BUN 20 06/14/2023   CO2 30 06/14/2023   TSH 2.79 06/14/2023   INR 1.06 07/22/2011   HGBA1C 5.8 06/14/2023   EKG: Normal sinus rhythm with sinus arrhythmia at 71 bpm, LAD, nonspecific ST and T wave abnormality.  Compared to last EKG from 2013 there are no significant changes     Assessment & Plan:   Physical exam: Screening blood work  ordered Exercise  none - stressed regular exercise Weight  obese Substance abuse  none   Reviewed  recommended immunizations.   Health Maintenance  Topic Date Due   COVID-19 Vaccine (5 - 2024-25 season) 08/01/2024 (Originally 08/21/2023)   INFLUENZA VACCINE  07/20/2024   DEXA SCAN  04/21/2025   Medicare Annual Wellness (AWV)  06/06/2025   DTaP/Tdap/Td (4 - Td or Tdap) 05/28/2030   Pneumococcal Vaccine: 50+ Years  Completed   Zoster Vaccines- Shingrix  Completed   Hepatitis B Vaccines  Aged Out   HPV VACCINES  Aged Out   Meningococcal B Vaccine  Aged Out   Hepatitis C Screening  Discontinued          See Problem List for Assessment and Plan of chronic medical problems.

## 2024-07-16 NOTE — Assessment & Plan Note (Addendum)
 Chronic BP controlled CBC, CMP Continue amlodipine  2.5 mg daily  EKG today

## 2024-07-16 NOTE — Assessment & Plan Note (Addendum)
Chronic DEXA up-to-date Stressed regular exercise Continue calcium and vitamin D supplementation Check vitamin D level

## 2024-07-16 NOTE — Patient Instructions (Addendum)
 Blood work was ordered.       Medications changes include :   None     Return in about 6 months (around 01/16/2025) for follow up.    Health Maintenance, Female Adopting a healthy lifestyle and getting preventive care are important in promoting health and wellness. Ask your health care provider about: The right schedule for you to have regular tests and exams. Things you can do on your own to prevent diseases and keep yourself healthy. What should I know about diet, weight, and exercise? Eat a healthy diet  Eat a diet that includes plenty of vegetables, fruits, low-fat dairy products, and lean protein. Do not eat a lot of foods that are high in solid fats, added sugars, or sodium. Maintain a healthy weight Body mass index (BMI) is used to identify weight problems. It estimates body fat based on height and weight. Your health care provider can help determine your BMI and help you achieve or maintain a healthy weight. Get regular exercise Get regular exercise. This is one of the most important things you can do for your health. Most adults should: Exercise for at least 150 minutes each week. The exercise should increase your heart rate and make you sweat (moderate-intensity exercise). Do strengthening exercises at least twice a week. This is in addition to the moderate-intensity exercise. Spend less time sitting. Even light physical activity can be beneficial. Watch cholesterol and blood lipids Have your blood tested for lipids and cholesterol at 81 years of age, then have this test every 5 years. Have your cholesterol levels checked more often if: Your lipid or cholesterol levels are high. You are older than 81 years of age. You are at high risk for heart disease. What should I know about cancer screening? Depending on your health history and family history, you may need to have cancer screening at various ages. This may include screening for: Breast cancer. Cervical  cancer. Colorectal cancer. Skin cancer. Lung cancer. What should I know about heart disease, diabetes, and high blood pressure? Blood pressure and heart disease High blood pressure causes heart disease and increases the risk of stroke. This is more likely to develop in people who have high blood pressure readings or are overweight. Have your blood pressure checked: Every 3-5 years if you are 28-59 years of age. Every year if you are 29 years old or older. Diabetes Have regular diabetes screenings. This checks your fasting blood sugar level. Have the screening done: Once every three years after age 22 if you are at a normal weight and have a low risk for diabetes. More often and at a younger age if you are overweight or have a high risk for diabetes. What should I know about preventing infection? Hepatitis B If you have a higher risk for hepatitis B, you should be screened for this virus. Talk with your health care provider to find out if you are at risk for hepatitis B infection. Hepatitis C Testing is recommended for: Everyone born from [birth year] through 1965. Anyone with known risk factors for hepatitis C. Sexually transmitted infections (STIs) Get screened for STIs, including gonorrhea and chlamydia, if: You are sexually active and are younger than 81 years of age. You are older than 81 years of age and your health care provider tells you that you are at risk for this type of infection. Your sexual activity has changed since you were last screened, and you are at increased risk for chlamydia or  gonorrhea. Ask your health care provider if you are at risk. Ask your health care provider about whether you are at high risk for HIV. Your health care provider may recommend a prescription medicine to help prevent HIV infection. If you choose to take medicine to prevent HIV, you should first get tested for HIV. You should then be tested every 3 months for as long as you are taking the  medicine. Pregnancy If you are about to stop having your period (premenopausal) and you may become pregnant, seek counseling before you get pregnant. Take 400 to 800 micrograms (mcg) of folic acid  every day if you become pregnant. Ask for birth control (contraception) if you want to prevent pregnancy. Osteoporosis and menopause Osteoporosis is a disease in which the bones lose minerals and strength with aging. This can result in bone fractures. If you are 85 years old or older, or if you are at risk for osteoporosis and fractures, ask your health care provider if you should: Be screened for bone loss. Take a calcium or vitamin D  supplement to lower your risk of fractures. Be given hormone replacement therapy (HRT) to treat symptoms of menopause. Follow these instructions at home: Alcohol use Do not drink alcohol if: Your health care provider tells you not to drink. You are pregnant, may be pregnant, or are planning to become pregnant. If you drink alcohol: Limit how much you have to: 0-1 drink a day. Know how much alcohol is in your drink. In the [place] one drink equals one 12 oz bottle of beer (355 mL), one 5 oz glass of wine (148 mL), or one 1 oz glass of hard liquor (44 mL). Lifestyle Do not use any products that contain nicotine or tobacco. These products include cigarettes, chewing tobacco, and vaping devices, such as e-cigarettes. If you need help quitting, ask your health care provider. Do not use street drugs. Do not share needles. Ask your health care provider for help if you need support or information about quitting drugs. General instructions Schedule regular health, dental, and eye exams. Stay current with your vaccines. Tell your health care provider if: You often feel depressed. You have ever been abused or do not feel safe at home. Summary Adopting a healthy lifestyle and getting preventive care are important in promoting health and wellness. Follow your health care  provider's instructions about healthy diet, exercising, and getting tested or screened for diseases. Follow your health care provider's instructions on monitoring your cholesterol and blood pressure. This information is not intended to replace advice given to you by your health care provider. Make sure you discuss any questions you have with your health care provider. Document Revised: 04/27/2021 Document Reviewed: 04/27/2021 Elsevier Patient Education  2024 [employer].

## 2024-07-16 NOTE — Assessment & Plan Note (Signed)
 Chronic Lab Results  Component Value Date   HGBA1C 5.8 06/14/2023   Check a1c Low sugar / carb diet Stressed regular exercise Encouraged weight loss

## 2024-07-17 ENCOUNTER — Telehealth: Payer: Self-pay | Admitting: Internal Medicine

## 2024-07-17 LAB — TSH: TSH: 2.98 u[IU]/mL (ref 0.35–5.50)

## 2024-07-17 LAB — VITAMIN D 25 HYDROXY (VIT D DEFICIENCY, FRACTURES): VITD: 54.99 ng/mL (ref 30.00–100.00)

## 2024-07-17 NOTE — Telephone Encounter (Unsigned)
 Copied from CRM [id number]. Topic: Clinical - Lab/Test Results >> Jul 17, 2024  1:08 PM Abigail Long wrote: Reason for CRM: Patient would like callback about her lab results from yesterday please.  Patient callback is [phone]

## 2024-07-18 ENCOUNTER — Ambulatory Visit: Payer: Self-pay | Admitting: Internal Medicine

## 2024-07-18 NOTE — Telephone Encounter (Signed)
Results given to patient today. 

## 2024-07-23 DIAGNOSIS — H5203 Hypermetropia, bilateral: Secondary | ICD-10-CM | POA: Diagnosis not present

## 2024-07-23 DIAGNOSIS — H25813 Combined forms of age-related cataract, bilateral: Secondary | ICD-10-CM | POA: Diagnosis not present

## 2024-07-31 DIAGNOSIS — C44519 Basal cell carcinoma of skin of other part of trunk: Secondary | ICD-10-CM | POA: Diagnosis not present

## 2024-07-31 DIAGNOSIS — D1801 Hemangioma of skin and subcutaneous tissue: Secondary | ICD-10-CM | POA: Diagnosis not present

## 2024-07-31 DIAGNOSIS — Z85828 Personal history of other malignant neoplasm of skin: Secondary | ICD-10-CM | POA: Diagnosis not present

## 2024-07-31 DIAGNOSIS — L821 Other seborrheic keratosis: Secondary | ICD-10-CM | POA: Diagnosis not present

## 2024-07-31 DIAGNOSIS — L570 Actinic keratosis: Secondary | ICD-10-CM | POA: Diagnosis not present

## 2024-07-31 DIAGNOSIS — L814 Other melanin hyperpigmentation: Secondary | ICD-10-CM | POA: Diagnosis not present

## 2024-09-04 ENCOUNTER — Telehealth: Payer: Self-pay | Admitting: Radiology

## 2024-09-04 NOTE — Telephone Encounter (Signed)
 Copied from CRM [id number]. Topic: Clinical - Lab/Test Results >> Sep 04, 2024  9:57 AM Abigail Long wrote: Reason for CRM: Pt received a message saying she had new test results in MyChart but did not see any recent results. Pls call pt.

## 2024-09-06 ENCOUNTER — Other Ambulatory Visit: Payer: Self-pay | Admitting: Internal Medicine

## 2024-09-17 ENCOUNTER — Other Ambulatory Visit: Payer: Self-pay | Admitting: Internal Medicine

## 2024-09-17 MED ORDER — AMLODIPINE BESYLATE 2.5 MG PO TABS: 2.5000 mg | ORAL_TABLET | Freq: Every day | ORAL | 2 refills | Status: AC

## 2024-09-17 NOTE — Telephone Encounter (Signed)
 Copied from CRM #[id number]. Topic: Clinical - Medication Refill >> Sep 17, 2024  2:10 PM [place] E wrote: Medication: amLODipine  (NORVASC ) 2.5 MG tablet  Has the patient contacted their pharmacy? Yes (Agent: If no, request that the patient contact the pharmacy for the refill. If patient does not wish to contact the pharmacy document the reason why and proceed with request.) (Agent: If yes, when and what did the pharmacy advise?)  This is the patient's preferred pharmacy:  [id number] - [place], [place] - [address] [place] [address] Phone: [phone] Fax: [phone]  Is this the correct pharmacy for this prescription? Yes If no, delete pharmacy and type the correct one.   Has the prescription been filled recently? No  Is the patient out of the medication? No, about a week left.  Has the patient been seen for an appointment in the last year OR does the patient have an upcoming appointment? Yes  Can we respond through MyChart? Yes  Agent: Please be advised that Rx refills may take up to 3 business days. We ask that you follow-up with your pharmacy.

## 2024-11-13 DIAGNOSIS — Z809 Family history of malignant neoplasm, unspecified: Secondary | ICD-10-CM | POA: Diagnosis not present

## 2024-11-13 DIAGNOSIS — Z8249 Family history of ischemic heart disease and other diseases of the circulatory system: Secondary | ICD-10-CM | POA: Diagnosis not present

## 2024-11-13 DIAGNOSIS — Z6835 Body mass index (BMI) 35.0-35.9, adult: Secondary | ICD-10-CM | POA: Diagnosis not present

## 2024-11-13 DIAGNOSIS — I10 Essential (primary) hypertension: Secondary | ICD-10-CM | POA: Diagnosis not present

## 2024-11-13 DIAGNOSIS — M8580 Other specified disorders of bone density and structure, unspecified site: Secondary | ICD-10-CM | POA: Diagnosis not present

## 2025-06-07 ENCOUNTER — Ambulatory Visit
